# Patient Record
Sex: Male | Born: 1970 | Race: White | Hispanic: No | Marital: Married | Smoking: Current every day smoker
Health system: Southern US, Community
[De-identification: ages and names within clinical notes are randomized; demographics above are authoritative.]

## PROBLEM LIST (undated history)

## (undated) DIAGNOSIS — R3915 Urgency of urination: Secondary | ICD-10-CM

## (undated) DIAGNOSIS — R350 Frequency of micturition: Secondary | ICD-10-CM

## (undated) DIAGNOSIS — G4733 Obstructive sleep apnea (adult) (pediatric): Secondary | ICD-10-CM

## (undated) DIAGNOSIS — N201 Calculus of ureter: Secondary | ICD-10-CM

## (undated) DIAGNOSIS — Z8781 Personal history of (healed) traumatic fracture: Secondary | ICD-10-CM

## (undated) HISTORY — PX: INGUINAL HERNIA REPAIR: SUR1180

## (undated) HISTORY — PX: KNEE ARTHROSCOPY: SUR90

---

## 2001-11-10 ENCOUNTER — Emergency Department (HOSPITAL_COMMUNITY): Admission: EM | Admit: 2001-11-10 | Discharge: 2001-11-10 | Payer: Self-pay | Admitting: *Deleted

## 2002-06-26 ENCOUNTER — Emergency Department (HOSPITAL_COMMUNITY): Admission: EM | Admit: 2002-06-26 | Discharge: 2002-06-27 | Payer: Self-pay | Admitting: Emergency Medicine

## 2003-02-26 ENCOUNTER — Emergency Department (HOSPITAL_COMMUNITY): Admission: EM | Admit: 2003-02-26 | Discharge: 2003-02-26 | Payer: Self-pay | Admitting: Emergency Medicine

## 2007-07-16 ENCOUNTER — Ambulatory Visit: Payer: Self-pay | Admitting: Internal Medicine

## 2007-07-16 DIAGNOSIS — R413 Other amnesia: Secondary | ICD-10-CM | POA: Insufficient documentation

## 2007-07-16 DIAGNOSIS — H547 Unspecified visual loss: Secondary | ICD-10-CM

## 2007-07-16 LAB — CONVERTED CEMR LAB
ALT: 19 units/L (ref 0–53)
AST: 19 units/L (ref 0–37)
Albumin: 4.1 g/dL (ref 3.5–5.2)
Alkaline Phosphatase: 58 units/L (ref 39–117)
BUN: 11 mg/dL (ref 6–23)
Basophils Absolute: 0.1 10*3/uL (ref 0.0–0.1)
Basophils Relative: 1.2 % — ABNORMAL HIGH (ref 0.0–1.0)
Bilirubin, Direct: 0.1 mg/dL (ref 0.0–0.3)
CO2: 30 meq/L (ref 19–32)
Calcium: 9.4 mg/dL (ref 8.4–10.5)
Chloride: 101 meq/L (ref 96–112)
Creatinine, Ser: 0.9 mg/dL (ref 0.4–1.5)
Eosinophils Absolute: 0.1 10*3/uL (ref 0.0–0.6)
Eosinophils Relative: 0.8 % (ref 0.0–5.0)
GFR calc Af Amer: 123 mL/min
GFR calc non Af Amer: 102 mL/min
Glucose, Bld: 81 mg/dL (ref 70–99)
HCT: 44.9 % (ref 39.0–52.0)
Hemoglobin: 15.4 g/dL (ref 13.0–17.0)
Lymphocytes Relative: 27.1 % (ref 12.0–46.0)
MCHC: 34.3 g/dL (ref 30.0–36.0)
MCV: 89.7 fL (ref 78.0–100.0)
Monocytes Absolute: 0.5 10*3/uL (ref 0.2–0.7)
Monocytes Relative: 4.7 % (ref 3.0–11.0)
Neutro Abs: 6.8 10*3/uL (ref 1.4–7.7)
Neutrophils Relative %: 66.2 % (ref 43.0–77.0)
Platelets: 255 10*3/uL (ref 150–400)
Potassium: 4.4 meq/L (ref 3.5–5.1)
RBC: 5.01 M/uL (ref 4.22–5.81)
RDW: 13.1 % (ref 11.5–14.6)
Sodium: 138 meq/L (ref 135–145)
TSH: 1.54 microintl units/mL (ref 0.35–5.50)
Total Bilirubin: 0.8 mg/dL (ref 0.3–1.2)
Total Protein: 6.6 g/dL (ref 6.0–8.3)
WBC: 10.3 10*3/uL (ref 4.5–10.5)

## 2007-07-18 ENCOUNTER — Telehealth (INDEPENDENT_AMBULATORY_CARE_PROVIDER_SITE_OTHER): Payer: Self-pay | Admitting: *Deleted

## 2007-07-20 ENCOUNTER — Encounter: Admission: RE | Admit: 2007-07-20 | Discharge: 2007-07-20 | Payer: Self-pay | Admitting: Internal Medicine

## 2007-07-21 ENCOUNTER — Encounter: Admission: RE | Admit: 2007-07-21 | Discharge: 2007-07-21 | Payer: Self-pay | Admitting: Internal Medicine

## 2007-07-26 ENCOUNTER — Encounter: Payer: Self-pay | Admitting: Internal Medicine

## 2007-09-06 ENCOUNTER — Telehealth: Payer: Self-pay | Admitting: Internal Medicine

## 2008-09-22 ENCOUNTER — Ambulatory Visit: Payer: Self-pay | Admitting: Psychology

## 2009-01-08 ENCOUNTER — Encounter: Payer: Self-pay | Admitting: Internal Medicine

## 2009-07-20 ENCOUNTER — Encounter: Admission: RE | Admit: 2009-07-20 | Discharge: 2009-07-20 | Payer: Self-pay | Admitting: General Surgery

## 2009-07-22 ENCOUNTER — Ambulatory Visit (HOSPITAL_BASED_OUTPATIENT_CLINIC_OR_DEPARTMENT_OTHER): Admission: RE | Admit: 2009-07-22 | Discharge: 2009-07-22 | Payer: Self-pay | Admitting: General Surgery

## 2010-11-17 ENCOUNTER — Emergency Department (HOSPITAL_COMMUNITY)
Admission: EM | Admit: 2010-11-17 | Discharge: 2010-11-17 | Disposition: A | Discharge status: Home / Self Care | Payer: BC Managed Care – PPO | Attending: Emergency Medicine | Admitting: Emergency Medicine

## 2010-11-17 DIAGNOSIS — R109 Unspecified abdominal pain: Secondary | ICD-10-CM | POA: Insufficient documentation

## 2010-11-17 DIAGNOSIS — Z9889 Other specified postprocedural states: Secondary | ICD-10-CM | POA: Insufficient documentation

## 2010-11-17 DIAGNOSIS — L02219 Cutaneous abscess of trunk, unspecified: Secondary | ICD-10-CM | POA: Insufficient documentation

## 2010-11-17 DIAGNOSIS — L03319 Cellulitis of trunk, unspecified: Secondary | ICD-10-CM | POA: Insufficient documentation

## 2010-11-23 LAB — POCT HEMOGLOBIN-HEMACUE: Hemoglobin: 15.4 g/dL (ref 13.0–17.0)

## 2010-11-24 LAB — DIFFERENTIAL
Basophils Relative: 1 % (ref 0–1)
Lymphs Abs: 2.6 10*3/uL (ref 0.7–4.0)
Monocytes Relative: 5 % (ref 3–12)
Neutro Abs: 5.5 10*3/uL (ref 1.7–7.7)
Neutrophils Relative %: 63 % (ref 43–77)

## 2010-11-24 LAB — CBC
MCHC: 34.5 g/dL (ref 30.0–36.0)
Platelets: 229 10*3/uL (ref 150–400)
RBC: 4.75 MIL/uL (ref 4.22–5.81)
WBC: 8.8 10*3/uL (ref 4.0–10.5)

## 2010-11-24 LAB — PROTIME-INR
INR: 1.05 (ref 0.00–1.49)
Prothrombin Time: 13.6 seconds (ref 11.6–15.2)

## 2010-11-24 LAB — APTT: aPTT: 33 seconds (ref 24–37)

## 2010-11-24 LAB — BASIC METABOLIC PANEL
BUN: 14 mg/dL (ref 6–23)
Calcium: 8.9 mg/dL (ref 8.4–10.5)
Creatinine, Ser: 0.85 mg/dL (ref 0.4–1.5)
GFR calc Af Amer: >60 mL/min (ref >60)

## 2011-06-28 ENCOUNTER — Other Ambulatory Visit: Payer: Self-pay | Admitting: Family Medicine

## 2011-06-28 DIAGNOSIS — R51 Headache: Secondary | ICD-10-CM

## 2011-07-04 ENCOUNTER — Ambulatory Visit
Admission: RE | Admit: 2011-07-04 | Discharge: 2011-07-04 | Disposition: A | Discharge status: Home / Self Care | Payer: BC Managed Care – PPO | Source: Ambulatory Visit | Attending: Family Medicine | Admitting: Family Medicine

## 2011-07-04 ENCOUNTER — Other Ambulatory Visit: Payer: BC Managed Care – PPO

## 2011-07-04 DIAGNOSIS — R51 Headache: Secondary | ICD-10-CM

## 2012-06-26 ENCOUNTER — Emergency Department (HOSPITAL_COMMUNITY)
Admission: EM | Admit: 2012-06-26 | Discharge: 2012-06-27 | Disposition: A | Discharge status: Home / Self Care | Payer: BC Managed Care – PPO | Attending: Emergency Medicine | Admitting: Emergency Medicine

## 2012-06-26 ENCOUNTER — Encounter (HOSPITAL_COMMUNITY): Payer: Self-pay | Admitting: Adult Health

## 2012-06-26 DIAGNOSIS — F17200 Nicotine dependence, unspecified, uncomplicated: Secondary | ICD-10-CM | POA: Insufficient documentation

## 2012-06-26 DIAGNOSIS — Z8659 Personal history of other mental and behavioral disorders: Secondary | ICD-10-CM | POA: Insufficient documentation

## 2012-06-26 DIAGNOSIS — L03119 Cellulitis of unspecified part of limb: Secondary | ICD-10-CM | POA: Insufficient documentation

## 2012-06-26 DIAGNOSIS — L02519 Cutaneous abscess of unspecified hand: Secondary | ICD-10-CM | POA: Insufficient documentation

## 2012-06-26 DIAGNOSIS — G4730 Sleep apnea, unspecified: Secondary | ICD-10-CM | POA: Insufficient documentation

## 2012-06-26 MED ORDER — CLINDAMYCIN HCL 150 MG PO CAPS
300.0000 mg | ORAL_CAPSULE | Freq: Once | ORAL | Status: AC
  Administered 2012-06-26: 300 mg via ORAL
  Filled 2012-06-26: qty 2

## 2012-06-26 MED ORDER — OXYCODONE-ACETAMINOPHEN 5-325 MG PO TABS
1.0000 | ORAL_TABLET | Freq: Once | ORAL | Status: AC
  Administered 2012-06-26: 1 via ORAL
  Filled 2012-06-26: qty 1

## 2012-06-26 NOTE — ED Notes (Addendum)
Reports small bump to right hand that began this am and has increased in size and redness, denies drainage.  Hot pack given. Hand red with quarter sized induration. CMS intact.

## 2012-06-26 NOTE — ED Provider Notes (Addendum)
History     CSN: [id number]  Arrival date & time 06/26/12  1914   First MD Initiated Contact with Patient 06/26/12 2221      Chief Complaint  Patient presents with  . Abscess    HPI  History provided by the patient. Vision is a 41 year old male with no significant PMH who presents with complaints of redness, pain and swelling to right dorsal hand. Symptoms began early this morning and have increased throughout the day. Patient went to urgent care Center and was then seen at [facility] orthopedics. Patient was told to follow back up tomorrow at [facility] orthopedics specialists. He has been using heat over the area but reports increasing redness, pain and swelling. Patient does report having an injury at work one week ago with small laceration to the skin near that area of pain and swelling. He denies any erythematous streaks. Denies any fever, chills or sweats. Denies any numbness or weakness or decreased range of motion in hand.    History reviewed. No pertinent past medical history.  History reviewed. No pertinent past surgical history.  History reviewed. No pertinent family history.  History  Substance Use Topics  . Smoking status: Current Some Day Smoker  . Smokeless tobacco: Not on file  . Alcohol Use: No      Review of Systems  Constitutional: Negative for fever, chills and diaphoresis.  Skin:       Redness and swelling over dorsal right hand    Allergies  Review of patient's allergies indicates no known allergies.  Home Medications   Current Outpatient Rx  Name  Route  Sig  Dispense  Refill  . ESCITALOPRAM OXALATE 10 MG PO TABS   Oral   Take 10 mg by mouth at bedtime.           BP 151/89  Pulse 82  Temp 98.9 F (37.2 C) (Oral)  Resp 16  SpO2 97%  Physical Exam  Nursing note and vitals reviewed. Constitutional: He is oriented to person, place, and time. He appears well-developed and well-nourished. No distress.  HENT:  Head:  Normocephalic.  Cardiovascular: Normal rate and regular rhythm.   Pulmonary/Chest: Effort normal and breath sounds normal.  Abdominal: Soft.  Musculoskeletal: Normal range of motion. He exhibits no edema and no tenderness.       Full range of motion of right hand and fingers. Normal strength. There is redness and tenderness over dorsal hand. See skin exam for more detail.  Neurological: He is alert and oriented to person, place, and time.  Skin: Skin is warm.       3 cm area of erythema over dorsal right hand with slight central induration and nodularity. There are two small areas of pointing and pustules. Tenderness to this area.    ED Course  Procedures   INCISION AND DRAINAGE Performed by: Angus Seller Consent: Verbal consent obtained. Risks and benefits: risks, benefits and alternatives were discussed Type: abscess  Body area: dorsal right hand  Anesthesia: local infiltration  Local anesthetic: lidocaine 2% with epinephrine  Anesthetic total: 3 ml  Complexity: simple Scalpel used for incision  Drainage: purulent  Drainage amount: small  Packing material: none  Patient tolerance: Patient tolerated the procedure well with no immediate complications.      1. Abscess of hand       MDM  10:10PM patient seen and evaluated. Patient in no acute distress. Side ultrasound used showing very small pocket of abscess under the skin. Small  I&D performed using a scalpel with slight drainage. Patient reports improvement of pain symptoms. Will place on clindamycin. Patient given orthopedic hand followup.        Angus Seller, PA 06/27/12 0559  Angus Seller, PA 07/12/12 0430

## 2012-06-26 NOTE — ED Notes (Signed)
[place] machine at bedside per provider's request.

## 2012-06-26 NOTE — ED Notes (Signed)
Patient provided with a heat pack per request.

## 2012-06-26 NOTE — ED Notes (Signed)
Patient provided with a heat pack per request.  Informed of continued need to wait for provider evaluation.

## 2012-06-27 ENCOUNTER — Observation Stay (HOSPITAL_COMMUNITY)
Admission: EM | Admit: 2012-06-27 | Discharge: 2012-06-28 | Disposition: A | Discharge status: Home / Self Care | Payer: BC Managed Care – PPO | Attending: Emergency Medicine | Admitting: Emergency Medicine

## 2012-06-27 ENCOUNTER — Encounter (HOSPITAL_COMMUNITY): Payer: Self-pay | Admitting: Family Medicine

## 2012-06-27 DIAGNOSIS — F17200 Nicotine dependence, unspecified, uncomplicated: Secondary | ICD-10-CM | POA: Insufficient documentation

## 2012-06-27 DIAGNOSIS — L03119 Cellulitis of unspecified part of limb: Secondary | ICD-10-CM

## 2012-06-27 DIAGNOSIS — L02519 Cutaneous abscess of unspecified hand: Principal | ICD-10-CM | POA: Insufficient documentation

## 2012-06-27 LAB — CBC WITH DIFFERENTIAL/PLATELET
Basophils Absolute: 0 10*3/uL (ref 0.0–0.1)
Basophils Relative: 0 % (ref 0–1)
Eosinophils Absolute: 0.2 10*3/uL (ref 0.0–0.7)
Hemoglobin: 15.3 g/dL (ref 13.0–17.0)
MCH: 30 pg (ref 26.0–34.0)
MCHC: 34.2 g/dL (ref 30.0–36.0)
Monocytes Relative: 7 % (ref 3–12)
Neutro Abs: 9.1 10*3/uL — ABNORMAL HIGH (ref 1.7–7.7)
Neutrophils Relative %: 69 % (ref 43–77)
Platelets: 240 10*3/uL (ref 150–400)

## 2012-06-27 LAB — BASIC METABOLIC PANEL
BUN: 10 mg/dL (ref 6–23)
Chloride: 100 mEq/L (ref 96–112)
GFR calc Af Amer: >90 mL/min (ref >90)
GFR calc non Af Amer: >90 mL/min (ref >90)
Potassium: 3.9 mEq/L (ref 3.5–5.1)
Sodium: 137 mEq/L (ref 135–145)

## 2012-06-27 MED ORDER — SODIUM CHLORIDE 0.9 % IV SOLN
INTRAVENOUS | Status: DC
  Administered 2012-06-27 – 2012-06-28 (×3): via INTRAVENOUS

## 2012-06-27 MED ORDER — OXYCODONE-ACETAMINOPHEN 5-325 MG PO TABS
1.0000 | ORAL_TABLET | Freq: Once | ORAL | Status: AC
  Administered 2012-06-27: 1 via ORAL
  Filled 2012-06-27: qty 1

## 2012-06-27 MED ORDER — OXYCODONE-ACETAMINOPHEN 5-325 MG PO TABS: 1.0000 | ORAL_TABLET | ORAL | Status: DC | PRN

## 2012-06-27 MED ORDER — CLINDAMYCIN HCL 300 MG PO CAPS: 300.0000 mg | ORAL_CAPSULE | Freq: Four times a day (QID) | ORAL | Status: DC

## 2012-06-27 MED ORDER — CLINDAMYCIN PHOSPHATE 600 MG/50ML IV SOLN
600.0000 mg | Freq: Once | INTRAVENOUS | Status: AC
  Administered 2012-06-27: 600 mg via INTRAVENOUS
  Filled 2012-06-27: qty 50

## 2012-06-27 MED ORDER — CLINDAMYCIN PHOSPHATE 600 MG/50ML IV SOLN
600.0000 mg | Freq: Four times a day (QID) | INTRAVENOUS | Status: DC
  Administered 2012-06-27 – 2012-06-28 (×3): 600 mg via INTRAVENOUS
  Filled 2012-06-27 (×3): qty 50

## 2012-06-27 MED ORDER — CLINDAMYCIN PHOSPHATE 600 MG/50ML IV SOLN: 600.0000 mg | Freq: Once | INTRAVENOUS | Status: AC

## 2012-06-27 MED ORDER — OXYCODONE-ACETAMINOPHEN 5-325 MG PO TABS
2.0000 | ORAL_TABLET | ORAL | Status: DC | PRN
  Administered 2012-06-28: 2 via ORAL
  Filled 2012-06-27: qty 2

## 2012-06-27 NOTE — ED Provider Notes (Signed)
Patient without a medical history was placed in CDU on a cellulitis protocol by Dr. Linwood Dibbles. Patient is here for IV antibiotics and hand consult and has received 1 dose of clindamycin.   Plan per previous provider is to place  in CDU for 24 observation and administer 4 total doses of clindamycin.  After the fourth toes the patient may be discharged and he is to followup in Dr. Ronie Spies office tomorrow afternoon.  Patient re-evaluated and is complaining of pain in the right hand along with worsening reddening and swelling, VSS. On exam: hemodynamically stable, NAD, heart w/ RRR, lungs CTAB, Chest & abd non-tender, no peripheral edema or calf tenderness.  Increasing edema approximately 3 cm past the last marked border.  No time associated with the last border.  Margins of erythema remarked and time stamped. Analgesia given.  Will continue to monitor for spreading infection.  I have discussed all this with the patient including the plan for administration of antibiotics and followup with hand surgeon. He expresses understanding and is amenable to the plan.   10:13 PM Pain medication redosed, and orders placed for 4 hour when necessary Percocet.  Erythema has not extended since the 1800 marking.  It appears to have decreased slightly and the swelling in the hand is also decreased.  Patient remains afebrile, vital signs stable, without further complaint.  Patient next dose of clindamycin is 2 AM.    Dierdre Forth, PA-C 06/27/12 2329

## 2012-06-27 NOTE — ED Notes (Signed)
Abscess to dorsal aspect of rgt hand

## 2012-06-27 NOTE — ED Notes (Signed)
Per pt was here last night for hand infection. Went to see hand specialist this am and they sent him here. Infection has gotten worse. Sent here for IV abx

## 2012-06-27 NOTE — Consult Note (Signed)
Reason for Consult:right hand swelling Referring Physician: jon knapp  Jesse [name] is an 41 y.o. male.  HPI: s/p attempted I and D in ER this am with continued pain and swelling on dorsum of right hand despite PO ABX  History reviewed. No pertinent past medical history.  History reviewed. No pertinent past surgical history.  History reviewed. No pertinent family history.  Social History:  reports that he has been smoking.  He does not have any smokeless tobacco history on file. He reports that he does not drink alcohol. His drug history not on file.  Allergies: No Known Allergies  Medications: Prior to Admission:  (Not in [name] hospital admission)  Results for orders placed during the hospital encounter of 06/27/12 (from the past 48 hour(s))  CBC WITH DIFFERENTIAL     Status: Abnormal   Collection Time   06/27/12  2:02 PM      Component Value Range Comment   WBC 13.2 (*) 4.0 - 10.5 K/uL    RBC 5.10  4.22 - 5.81 MIL/uL    Hemoglobin 15.3  13.0 - 17.0 g/dL    HCT 16.1  09.6 - 04.5 %    MCV 87.6  78.0 - 100.0 fL    MCH 30.0  26.0 - 34.0 pg    MCHC 34.2  30.0 - 36.0 g/dL    RDW 40.9  81.1 - 91.4 %    Platelets 240  150 - 400 K/uL    Neutrophils Relative 69  43 - 77 %    Neutro Abs 9.1 (*) 1.7 - 7.7 K/uL    Lymphocytes Relative 23  12 - 46 %    Lymphs Abs 3.1  0.7 - 4.0 K/uL    Monocytes Relative 7  3 - 12 %    Monocytes Absolute 0.9  0.1 - 1.0 K/uL    Eosinophils Relative 1  0 - 5 %    Eosinophils Absolute 0.2  0.0 - 0.7 K/uL    Basophils Relative 0  0 - 1 %    Basophils Absolute 0.0  0.0 - 0.1 K/uL     No results found.  Review of Systems  All other systems reviewed and are negative.   Blood pressure 134/82, pulse 81, temperature 98.3 F (36.8 C), resp. rate 18, SpO2 98.00%. Physical Exam  Constitutional: He is oriented to person, place, and time. He appears well-developed and well-nourished.  HENT:  Head: Normocephalic and atraumatic.  Cardiovascular:  Normal rate.   Respiratory: Effort normal.  Musculoskeletal:       Hands: Neurological: He is alert and oriented to person, place, and time.  Skin: Skin is warm.  Psychiatric: He has [name] normal mood and affect. His behavior is normal. Judgment and thought content normal.    Assessment/Plan: As above  Agree with IV ABX x 23 hours for cellulitis with followup in my office 11/7 afternoon  Jesse [name] 06/27/2012, 2:56 PM

## 2012-06-27 NOTE — ED Provider Notes (Addendum)
History   This chart was scribed for Jesse Kras, MD by Albertha Ghee Rifaie. This patient was seen in room TR07C/TR07C and the patient's care was started at 1:30 PM.    CSN: [id number]  Arrival date & time 06/27/12  1136   First MD Initiated Contact with Patient 06/27/12 1330      Chief Complaint  Patient presents with  . Wound Infection     The history is provided by the patient. No language interpreter was used.    Jesse [name] is a 41 y.o. male who presents to the Emergency Department complaining of gradually worsening, non-radiating, severe right arm pain with wound infection. There is associated swelling with some decreased ROM to the right arm. Patient was seen at urgent care yesterday when he was diagnosed with an abscess and had an I&D procedure performed to his hand. Patient was discharged in stable condition and was referred to [facility] orthopedics. He states following up with him this morning and was sent here for further evaluation. He has been using heat over the area but reports there has been increasing redness, pain and swelling. Patient reports having injured his hand one week ago with small laceration to the skin to the same area. He denies any erythematous streaks. He denies any numbness or weakness, fever, chills, nausea, and vomiting. The patient is a current smoker.    History reviewed. No pertinent past medical history.  History reviewed. No pertinent past surgical history.  History reviewed. No pertinent family history.  History  Substance Use Topics  . Smoking status: Current Some Day Smoker  . Smokeless tobacco: Not on file  . Alcohol Use: No      Review of Systems A complete 10 system review of systems was obtained and all systems are negative except as noted in the HPI and PMH.   Allergies  Review of patient's allergies indicates no known allergies.  Home Medications   Current Outpatient Rx  Name  Route  Sig  Dispense  Refill  .  CLINDAMYCIN HCL 300 MG PO CAPS   Oral   Take 1 capsule (300 mg total) by mouth 4 (four) times daily. X 7 days   28 capsule   0   . ESCITALOPRAM OXALATE 10 MG PO TABS   Oral   Take 10 mg by mouth at bedtime.         . OXYCODONE-ACETAMINOPHEN 5-325 MG PO TABS   Oral   Take 1 tablet by mouth every 4 (four) hours as needed for pain.   10 tablet   0     BP 134/82  Pulse 81  Temp 98.3 F (36.8 C)  Resp 18  SpO2 98%  Physical Exam  Nursing note and vitals reviewed. Constitutional: He appears well-developed and well-nourished. No distress.  HENT:  Head: Normocephalic and atraumatic.  Right Ear: External ear normal.  Left Ear: External ear normal.  Eyes: Conjunctivae normal are normal. Right eye exhibits no discharge. Left eye exhibits no discharge. No scleral icterus.  Neck: Neck supple. No tracheal deviation present.  Cardiovascular: Normal rate.   Pulmonary/Chest: Effort normal. No stridor. No respiratory distress.  Musculoskeletal: He exhibits no edema.       Swelling and erythema to dorsum of right hand. Mild induration present. Able to passively flex and extend hand. Mild discomfort with full flexion of the hand.  Neurological: He is alert. Cranial nerve deficit: no gross deficits.  Skin: Skin is warm and dry. No rash  noted.  Psychiatric: He has a normal mood and affect.    ED Course  Procedures (including critical care time)  DIAGNOSTIC STUDIES: Oxygen Saturation is 98% on room air, normal by my interpretation.    COORDINATION OF CARE: 1:45 PM Discussed treatment plan  with pt at bedside and pt agreed to plan.  Labs Reviewed  CBC WITH DIFFERENTIAL - Abnormal; Notable for the following:    WBC 13.2 (*)     Neutro Abs 9.1 (*)     All other components within normal limits  BASIC METABOLIC PANEL   No results found.   MDM  Patient appears to have a hand cellulitis. The symptoms have been progressing. At this time is not clear that he has a tenosynovitis. It  is certainly possible that he could be on the early part of that spectrum.   He has been seen in the emergency department as well as by orthopedics previously. He started taking oral clindamycin however the symptoms have progressed. Patient was referred to Dr. Mina Marble. He suggests the patient in the emergency room IV antibiotics and he plans on seeing the patient here in the emergency department. I have spoken with Dr. Ronie Spies office. They will let Dr. Mina Marble know that he is here. I have ordered IV antibiotics, and we will continue treatment in the clinical decision unit area  I personally performed the services described in this documentation, which was scribed in my presence.  The recorded information has been reviewed and considered.    Jesse Kras, MD 06/27/12 1354  Pt was seen by Dr Mina Marble.  No need for or drainage at this time.  Recc 24 hour abx.  DC home and follow up in his office tomorrow afternoon for recheck.  Jesse Kras, MD 06/27/12 [phone]

## 2012-06-27 NOTE — ED Notes (Signed)
Dinner tray ordered.

## 2012-06-28 ENCOUNTER — Other Ambulatory Visit: Payer: Self-pay | Admitting: Orthopedic Surgery

## 2012-06-28 ENCOUNTER — Encounter (HOSPITAL_COMMUNITY): Payer: Self-pay | Admitting: *Deleted

## 2012-06-28 MED ORDER — CLINDAMYCIN HCL 150 MG PO CAPS: 300.0000 mg | ORAL_CAPSULE | Freq: Three times a day (TID) | ORAL | Status: DC

## 2012-06-28 NOTE — ED Notes (Signed)
Report to greg rn

## 2012-06-28 NOTE — ED Provider Notes (Signed)
Medical screening examination/treatment/procedure(s) were performed by non-physician practitioner and as supervising physician I was immediately available for consultation/collaboration.  John-Adam Tzipporah Nagorski, M.D.     John-Adam Elaura Calix, MD 06/28/12 1727 

## 2012-06-28 NOTE — ED Provider Notes (Signed)
Medical screening examination/treatment/procedure(s) were conducted as a shared visit with non-physician practitioner(s) and myself.  I personally evaluated the patient during the encounter   Jesse Kras, MD 06/28/12 1504

## 2012-06-28 NOTE — ED Provider Notes (Signed)
9:14 AM Patient with a hx sig for wound infection was placed in CDU on cellulitis protocol. Patient care resumed from Dr. Effie Shy.  Patient is here for IV antibiotics and has received IV antibiotics. While in obeservation over night the pt slept well and had no complaints, per nursing staff. Patient re-evaluated and is resting comfortable, VSS, with no new complaints or concerns at this time. Plan per previous provider is to receive 4 doses of IV antibiotics and be discharged. If no improvement, Dr. Mina Marble will see the patient. On exam: hemodynamically stable, NAD, heart w/ RRR, lungs CTAB, Chest & abd non-tender, no peripheral edema or calf tenderness.  9:31 AM Patient has received fourth dose of Clindamycin and is ready for discharge. Erythema has no extended past purple markings. Patient will follow up with Dr. Mina Marble today. Vitals stable.     Emilia Beck, PA-C 06/28/12 1123

## 2012-06-29 ENCOUNTER — Ambulatory Visit (HOSPITAL_COMMUNITY)
Admission: RE | Admit: 2012-06-29 | Discharge: 2012-06-29 | Disposition: A | Discharge status: Home / Self Care | Payer: BC Managed Care – PPO | Source: Ambulatory Visit | Attending: Orthopedic Surgery | Admitting: Orthopedic Surgery

## 2012-06-29 ENCOUNTER — Encounter (HOSPITAL_COMMUNITY): Payer: Self-pay | Admitting: Anesthesiology

## 2012-06-29 ENCOUNTER — Encounter (HOSPITAL_COMMUNITY): Payer: Self-pay

## 2012-06-29 ENCOUNTER — Encounter (HOSPITAL_COMMUNITY)
Admission: RE | Disposition: A | Discharge status: Home / Self Care | Payer: Self-pay | Source: Ambulatory Visit | Attending: Orthopedic Surgery

## 2012-06-29 DIAGNOSIS — Z538 Procedure and treatment not carried out for other reasons: Secondary | ICD-10-CM | POA: Insufficient documentation

## 2012-06-29 DIAGNOSIS — L089 Local infection of the skin and subcutaneous tissue, unspecified: Secondary | ICD-10-CM | POA: Insufficient documentation

## 2012-06-29 LAB — SURGICAL PCR SCREEN
MRSA, PCR: NEGATIVE
Staphylococcus aureus: NEGATIVE

## 2012-06-29 SURGERY — CANCELLED PROCEDURE

## 2012-06-29 MED ORDER — CHLORHEXIDINE GLUCONATE 4 % EX LIQD: 60.0000 mL | Freq: Once | CUTANEOUS | Status: DC

## 2012-06-29 MED ORDER — LACTATED RINGERS IV SOLN
INTRAVENOUS | Status: DC
  Administered 2012-06-29: 15:00:00 via INTRAVENOUS

## 2012-06-29 MED ORDER — MUPIROCIN 2 % EX OINT
TOPICAL_OINTMENT | Freq: Once | CUTANEOUS | Status: AC
  Administered 2012-06-29: 1 via NASAL

## 2012-06-29 MED ORDER — MUPIROCIN 2 % EX OINT
TOPICAL_OINTMENT | CUTANEOUS | Status: AC
  Administered 2012-06-29: 1 via NASAL
  Filled 2012-06-29: qty 22

## 2012-06-29 SURGICAL SUPPLY — 50 items
BANDAGE CONFORM 2  STR LF (GAUZE/BANDAGES/DRESSINGS) IMPLANT
BANDAGE ELASTIC 3 VELCRO ST LF (GAUZE/BANDAGES/DRESSINGS) ×3 IMPLANT
BANDAGE ELASTIC 4 VELCRO ST LF (GAUZE/BANDAGES/DRESSINGS) ×3 IMPLANT
BANDAGE GAUZE ELAST BULKY 4 IN (GAUZE/BANDAGES/DRESSINGS) ×6 IMPLANT
BNDG CMPR 9X4 STRL LF SNTH (GAUZE/BANDAGES/DRESSINGS)
BNDG ESMARK 4X9 LF (GAUZE/BANDAGES/DRESSINGS) IMPLANT
CLOTH BEACON ORANGE TIMEOUT ST (SAFETY) ×3 IMPLANT
CORDS BIPOLAR (ELECTRODE) IMPLANT
COVER SURGICAL LIGHT HANDLE (MISCELLANEOUS) ×6 IMPLANT
CUFF TOURNIQUET SINGLE 18IN (TOURNIQUET CUFF) ×3 IMPLANT
DECANTER SPIKE VIAL GLASS SM (MISCELLANEOUS) ×3 IMPLANT
DRAIN PENROSE 1/4X12 LTX STRL (WOUND CARE) IMPLANT
DRAPE OEC MINIVIEW 54X84 (DRAPES) IMPLANT
DRAPE SURG 17X23 STRL (DRAPES) ×3 IMPLANT
DRSG PAD ABDOMINAL 8X10 ST (GAUZE/BANDAGES/DRESSINGS) ×6 IMPLANT
DURAPREP 26ML APPLICATOR (WOUND CARE) IMPLANT
ELECT REM PT RETURN 9FT ADLT (ELECTROSURGICAL)
ELECTRODE REM PT RTRN 9FT ADLT (ELECTROSURGICAL) IMPLANT
GAUZE PACKING IODOFORM 1/4X5 (PACKING) IMPLANT
GAUZE XEROFORM 1X8 LF (GAUZE/BANDAGES/DRESSINGS) ×3 IMPLANT
GLOVE BIO SURGEON STRL SZ8.5 (GLOVE) ×3 IMPLANT
GOWN PREVENTION PLUS XLARGE (GOWN DISPOSABLE) ×3 IMPLANT
GOWN STRL NON-REIN LRG LVL3 (GOWN DISPOSABLE) ×3 IMPLANT
HANDPIECE INTERPULSE COAX TIP (DISPOSABLE)
KIT BASIN OR (CUSTOM PROCEDURE TRAY) ×3 IMPLANT
KIT ROOM TURNOVER OR (KITS) ×3 IMPLANT
MANIFOLD NEPTUNE II (INSTRUMENTS) ×3 IMPLANT
NDL HYPO 25GX1X1/2 BEV (NEEDLE) IMPLANT
NDL HYPO 25X1 1.5 SAFETY (NEEDLE) ×1 IMPLANT
NEEDLE HYPO 25GX1X1/2 BEV (NEEDLE) IMPLANT
NEEDLE HYPO 25X1 1.5 SAFETY (NEEDLE) ×2 IMPLANT
NS IRRIG 1000ML POUR BTL (IV SOLUTION) ×3 IMPLANT
PACK ORTHO EXTREMITY (CUSTOM PROCEDURE TRAY) ×3 IMPLANT
PAD ARMBOARD 7.5X6 YLW CONV (MISCELLANEOUS) ×6 IMPLANT
PAD CAST 4YDX4 CTTN HI CHSV (CAST SUPPLIES) ×4 IMPLANT
PADDING CAST COTTON 4X4 STRL (CAST SUPPLIES) ×4
SET HNDPC FAN SPRY TIP SCT (DISPOSABLE) IMPLANT
SPONGE GAUZE 4X4 12PLY (GAUZE/BANDAGES/DRESSINGS) ×6 IMPLANT
SPONGE LAP 18X18 X RAY DECT (DISPOSABLE) ×3 IMPLANT
SUCTION FRAZIER TIP 10 FR DISP (SUCTIONS) ×3 IMPLANT
SUT VICRYL RAPIDE 4/0 PS 2 (SUTURE) IMPLANT
SYR 20CC LL (SYRINGE) IMPLANT
SYR CONTROL 10ML LL (SYRINGE) ×3 IMPLANT
TOWEL OR 17X24 6PK STRL BLUE (TOWEL DISPOSABLE) ×3 IMPLANT
TOWEL OR 17X26 10 PK STRL BLUE (TOWEL DISPOSABLE) ×3 IMPLANT
TUBE ANAEROBIC SPECIMEN COL (MISCELLANEOUS) IMPLANT
TUBE CONNECTING 12X1/4 (SUCTIONS) ×3 IMPLANT
UNDERPAD 30X30 INCONTINENT (UNDERPADS AND DIAPERS) ×3 IMPLANT
WATER STERILE IRR 1000ML POUR (IV SOLUTION) ×3 IMPLANT
YANKAUER SUCT BULB TIP NO VENT (SUCTIONS) ×3 IMPLANT

## 2012-06-29 NOTE — Anesthesia Preprocedure Evaluation (Deleted)
Anesthesia Evaluation  Patient identified by MRN, date of birth, ID band Patient awake    Reviewed: Allergy & Precautions, H&P , NPO status , Patient's Chart, lab work & pertinent test results  Airway Mallampati: II      Dental  (+) Teeth Intact and Dental Advisory Given   Pulmonary  breath sounds clear to auscultation        Cardiovascular Rhythm:Regular Rate:Normal     Neuro/Psych    GI/Hepatic   Endo/Other    Renal/GU      Musculoskeletal   Abdominal   Peds  Hematology   Anesthesia Other Findings   Reproductive/Obstetrics                           Anesthesia Physical Anesthesia Plan  ASA: II  Anesthesia Plan: General   Post-op Pain Management:    Induction: Intravenous  Airway Management Planned: LMA  Additional Equipment:   Intra-op Plan:   Post-operative Plan:   Informed Consent: I have reviewed the patients History and Physical, chart, labs and discussed the procedure including the risks, benefits and alternatives for the proposed anesthesia with the patient or authorized representative who has indicated his/her understanding and acceptance.   Dental advisory given  Plan Discussed with: CRNA and Surgeon  Anesthesia Plan Comments:         Anesthesia Quick Evaluation  

## 2012-06-29 NOTE — Progress Notes (Signed)
Per Dr. Mina Marble case cancelled patient given instructions for further care by Dr. Mina Marble.

## 2012-07-03 NOTE — ED Provider Notes (Signed)
Medical screening examination/treatment/procedure(s) were performed by non-physician practitioner and as supervising physician I was immediately available for consultation/collaboration.  Flint Melter, MD 07/03/12 [phone]

## 2012-07-12 NOTE — ED Provider Notes (Signed)
Medical screening examination/treatment/procedure(s) were performed by non-physician practitioner and as supervising physician I was immediately available for consultation/collaboration.  Jones Skene, M.D.      Jones Skene, MD 07/12/12 [phone]

## 2014-09-04 ENCOUNTER — Encounter (HOSPITAL_COMMUNITY): Payer: Self-pay | Admitting: Orthopedic Surgery

## 2015-10-12 ENCOUNTER — Other Ambulatory Visit: Payer: Self-pay | Admitting: Physician Assistant

## 2015-10-12 DIAGNOSIS — M7989 Other specified soft tissue disorders: Secondary | ICD-10-CM

## 2015-10-15 ENCOUNTER — Ambulatory Visit
Admission: RE | Admit: 2015-10-15 | Discharge: 2015-10-15 | Disposition: A | Discharge status: Home / Self Care | Payer: Managed Care, Other (non HMO) | Source: Ambulatory Visit | Attending: Physician Assistant | Admitting: Physician Assistant

## 2015-10-15 DIAGNOSIS — M7989 Other specified soft tissue disorders: Secondary | ICD-10-CM

## 2015-12-12 ENCOUNTER — Ambulatory Visit (HOSPITAL_COMMUNITY)
Admission: EM | Admit: 2015-12-12 | Discharge: 2015-12-12 | Disposition: A | Discharge status: Home / Self Care | Payer: Managed Care, Other (non HMO) | Attending: Emergency Medicine | Admitting: Emergency Medicine

## 2015-12-12 ENCOUNTER — Encounter (HOSPITAL_COMMUNITY): Payer: Self-pay

## 2015-12-12 DIAGNOSIS — T2010XA Burn of first degree of head, face, and neck, unspecified site, initial encounter: Secondary | ICD-10-CM

## 2015-12-12 DIAGNOSIS — T23101A Burn of first degree of right hand, unspecified site, initial encounter: Secondary | ICD-10-CM
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
## 2016-06-04 ENCOUNTER — Inpatient Hospital Stay (HOSPITAL_COMMUNITY)
Admission: EM | Admit: 2016-06-04 | Discharge: 2016-06-09 | DRG: 519 | Disposition: A | Discharge status: Home Health | Payer: Managed Care, Other (non HMO) | Attending: Orthopedic Surgery | Admitting: Orthopedic Surgery

## 2016-06-04 ENCOUNTER — Emergency Department (HOSPITAL_COMMUNITY): Payer: Managed Care, Other (non HMO)

## 2016-06-04 ENCOUNTER — Encounter (HOSPITAL_COMMUNITY): Payer: Self-pay | Admitting: Emergency Medicine

## 2016-06-04 DIAGNOSIS — S32810A Multiple fractures of pelvis with stable disruption of pelvic ring, initial encounter for closed fracture: Secondary | ICD-10-CM

## 2016-06-04 DIAGNOSIS — F17210 Nicotine dependence, cigarettes, uncomplicated: Secondary | ICD-10-CM | POA: Diagnosis present

## 2016-06-04 DIAGNOSIS — S50312A Abrasion of left elbow, initial encounter: Secondary | ICD-10-CM | POA: Diagnosis present

## 2016-06-04 DIAGNOSIS — F17200 Nicotine dependence, unspecified, uncomplicated: Secondary | ICD-10-CM | POA: Diagnosis present

## 2016-06-04 DIAGNOSIS — R262 Difficulty in walking, not elsewhere classified: Secondary | ICD-10-CM

## 2016-06-04 DIAGNOSIS — T1490XA Injury, unspecified, initial encounter: Secondary | ICD-10-CM

## 2016-06-04 DIAGNOSIS — S32512A Fracture of superior rim of left pubis, initial encounter for closed fracture: Secondary | ICD-10-CM | POA: Diagnosis present

## 2016-06-04 DIAGNOSIS — S3210XA Unspecified fracture of sacrum, initial encounter for closed fracture: Secondary | ICD-10-CM | POA: Diagnosis not present

## 2016-06-04 DIAGNOSIS — S32402A Unspecified fracture of left acetabulum, initial encounter for closed fracture: Secondary | ICD-10-CM

## 2016-06-04 DIAGNOSIS — T148XXA Other injury of unspecified body region, initial encounter: Secondary | ICD-10-CM

## 2016-06-04 DIAGNOSIS — S329XXA Fracture of unspecified parts of lumbosacral spine and pelvis, initial encounter for closed fracture: Secondary | ICD-10-CM

## 2016-06-04 DIAGNOSIS — Y92410 Unspecified street and highway as the place of occurrence of the external cause: Secondary | ICD-10-CM

## 2016-06-04 DIAGNOSIS — R402362 Coma scale, best motor response, obeys commands, at arrival to emergency department: Secondary | ICD-10-CM | POA: Diagnosis present

## 2016-06-04 DIAGNOSIS — R402252 Coma scale, best verbal response, oriented, at arrival to emergency department: Secondary | ICD-10-CM | POA: Diagnosis present

## 2016-06-04 DIAGNOSIS — R402142 Coma scale, eyes open, spontaneous, at arrival to emergency department: Secondary | ICD-10-CM | POA: Diagnosis present

## 2016-06-04 DIAGNOSIS — D62 Acute posthemorrhagic anemia: Secondary | ICD-10-CM | POA: Diagnosis not present

## 2016-06-04 LAB — I-STAT CHEM 8, ED
BUN: 15 mg/dL (ref 6–20)
BUN: 12 mg/dL (ref 6–20)
CHLORIDE: 104 mmol/L (ref 101–111)
Calcium, Ion: 1.08 mmol/L — ABNORMAL LOW (ref 1.15–1.40)
Calcium, Ion: 1.09 mmol/L — ABNORMAL LOW (ref 1.15–1.40)
Chloride: 104 mmol/L (ref 101–111)
Creatinine, Ser: 1 mg/dL (ref 0.61–1.24)
Creatinine, Ser: 0.9 mg/dL (ref 0.61–1.24)
Glucose, Bld: 94 mg/dL (ref 65–99)
Glucose, Bld: 92 mg/dL (ref 65–99)
HEMATOCRIT: 44 % (ref 39.0–52.0)
HEMATOCRIT: 41 % (ref 39.0–52.0)
HEMOGLOBIN: 15 g/dL (ref 13.0–17.0)
Hemoglobin: 13.9 g/dL (ref 13.0–17.0)
POTASSIUM: 4.4 mmol/L (ref 3.5–5.1)
Potassium: 6.6 mmol/L (ref 3.5–5.1)
SODIUM: 139 mmol/L (ref 135–145)
SODIUM: 142 mmol/L (ref 135–145)
TCO2: 28 mmol/L (ref 0–100)
TCO2: 25 mmol/L (ref 0–100)

## 2016-06-04 LAB — URINALYSIS, ROUTINE W REFLEX MICROSCOPIC
Bilirubin Urine: NEGATIVE
GLUCOSE, UA: NEGATIVE mg/dL
KETONES UR: NEGATIVE mg/dL
LEUKOCYTES UA: NEGATIVE
NITRITE: NEGATIVE
PH: 5 (ref 5.0–8.0)
PROTEIN: NEGATIVE mg/dL
Specific Gravity, Urine: 1.029 (ref 1.005–1.030)

## 2016-06-04 LAB — COMPREHENSIVE METABOLIC PANEL
ALT: 17 U/L (ref 17–63)
ANION GAP: 6 (ref 5–15)
AST: 29 U/L (ref 15–41)
Albumin: 3.5 g/dL (ref 3.5–5.0)
Alkaline Phosphatase: 47 U/L (ref 38–126)
BILIRUBIN TOTAL: 0.4 mg/dL (ref 0.3–1.2)
BUN: 10 mg/dL (ref 6–20)
CO2: 24 mmol/L (ref 22–32)
Calcium: 8.1 mg/dL — ABNORMAL LOW (ref 8.9–10.3)
Chloride: 106 mmol/L (ref 101–111)
Creatinine, Ser: 0.91 mg/dL (ref 0.61–1.24)
GFR calc Af Amer: >60 mL/min (ref >60)
Glucose, Bld: 96 mg/dL (ref 65–99)
POTASSIUM: 4.4 mmol/L (ref 3.5–5.1)
Sodium: 136 mmol/L (ref 135–145)
TOTAL PROTEIN: 5.7 g/dL — AB (ref 6.5–8.1)

## 2016-06-04 LAB — PROTIME-INR
INR: 1.07
Prothrombin Time: 13.9 seconds (ref 11.4–15.2)

## 2016-06-04 LAB — CBC WITH DIFFERENTIAL/PLATELET
Basophils Absolute: 0 10*3/uL (ref 0.0–0.1)
Basophils Relative: 0 %
EOS ABS: 0.2 10*3/uL (ref 0.0–0.7)
Eosinophils Relative: 1 %
HCT: 40.4 % (ref 39.0–52.0)
HEMOGLOBIN: 13.7 g/dL (ref 13.0–17.0)
LYMPHS ABS: 2.5 10*3/uL (ref 0.7–4.0)
LYMPHS PCT: 19 %
MCH: 30 pg (ref 26.0–34.0)
MCHC: 33.9 g/dL (ref 30.0–36.0)
MCV: 88.4 fL (ref 78.0–100.0)
Monocytes Absolute: 0.8 10*3/uL (ref 0.1–1.0)
Monocytes Relative: 6 %
NEUTROS ABS: 9.7 10*3/uL — AB (ref 1.7–7.7)
NEUTROS PCT: 74 %
Platelets: 226 10*3/uL (ref 150–400)
RBC: 4.57 MIL/uL (ref 4.22–5.81)
RDW: 13.2 % (ref 11.5–15.5)
WBC: 13.2 10*3/uL — AB (ref 4.0–10.5)

## 2016-06-04 LAB — SAMPLE TO BLOOD BANK

## 2016-06-04 LAB — URINE MICROSCOPIC-ADD ON
RBC / HPF: NONE SEEN RBC/hpf (ref 0–5)
WBC, UA: NONE SEEN WBC/hpf (ref 0–5)

## 2016-06-04 LAB — CBC
HCT: 44.3 % (ref 39.0–52.0)
HEMOGLOBIN: 14.7 g/dL (ref 13.0–17.0)
MCH: 29.5 pg (ref 26.0–34.0)
MCHC: 33.2 g/dL (ref 30.0–36.0)
MCV: 88.8 fL (ref 78.0–100.0)
Platelets: 240 10*3/uL (ref 150–400)
RBC: 4.99 MIL/uL (ref 4.22–5.81)
RDW: 13.2 % (ref 11.5–15.5)
WBC: 13.1 10*3/uL — AB (ref 4.0–10.5)

## 2016-06-04 LAB — I-STAT CG4 LACTIC ACID, ED: Lactic Acid, Venous: 0.75 mmol/L (ref 0.5–1.9)

## 2016-06-04 LAB — CDS SEROLOGY

## 2016-06-04 LAB — ETHANOL

## 2016-06-04 MED ORDER — ONDANSETRON HCL 4 MG/2ML IJ SOLN
4.0000 mg | Freq: Once | INTRAMUSCULAR | Status: AC
  Administered 2016-06-04: 4 mg via INTRAVENOUS

## 2016-06-04 MED ORDER — ONDANSETRON HCL 4 MG/2ML IJ SOLN
INTRAMUSCULAR | Status: AC
  Filled 2016-06-04: qty 2

## 2016-06-04 MED ORDER — HYDROMORPHONE HCL 1 MG/ML IJ SOLN
INTRAMUSCULAR | Status: AC
  Filled 2016-06-04: qty 1

## 2016-06-04 MED ORDER — SODIUM CHLORIDE 0.9 % IV BOLUS (SEPSIS): 1000.0000 mL | Freq: Once | INTRAVENOUS | Status: DC

## 2016-06-04 MED ORDER — IOPAMIDOL (ISOVUE-300) INJECTION 61%
INTRAVENOUS | Status: AC
  Administered 2016-06-04: 100 mL
  Filled 2016-06-04: qty 100

## 2016-06-04 MED ORDER — HYDROMORPHONE HCL 1 MG/ML IJ SOLN
1.0000 mg | Freq: Once | INTRAMUSCULAR | Status: AC
  Administered 2016-06-04: 1 mg via INTRAVENOUS
  Filled 2016-06-04: qty 1

## 2016-06-04 MED ORDER — HYDROMORPHONE HCL 1 MG/ML IJ SOLN
INTRAMUSCULAR | Status: DC | PRN
  Administered 2016-06-04: 1 mg via INTRAVENOUS

## 2016-06-04 MED ORDER — HYDROMORPHONE HCL 1 MG/ML IJ SOLN: 1.0000 mg | Freq: Once | INTRAMUSCULAR | Status: DC

## 2016-06-04 MED ORDER — HYDROMORPHONE HCL 1 MG/ML IJ SOLN
1.0000 mg | Freq: Once | INTRAMUSCULAR | Status: AC
  Administered 2016-06-04: 1 mg via INTRAVENOUS

## 2016-06-04 MED ORDER — SODIUM CHLORIDE 0.9 % IV SOLN
INTRAVENOUS | Status: AC | PRN
  Administered 2016-06-04: 1000 mL via INTRAVENOUS
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
## 2016-06-04 NOTE — ED Notes (Signed)
Family updated as to patient's status, wife at bedside at this time.
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
## 2016-06-04 NOTE — ED Notes (Signed)
Family requesting update, MD notified.

## 2016-06-04 NOTE — ED Notes (Signed)
Family in waiting room 
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
## 2016-06-05 ENCOUNTER — Inpatient Hospital Stay (HOSPITAL_COMMUNITY): Payer: Managed Care, Other (non HMO)

## 2016-06-05 ENCOUNTER — Encounter (HOSPITAL_COMMUNITY)
Admission: EM | Disposition: A | Discharge status: Home Health | Payer: Self-pay | Source: Home / Self Care | Attending: Orthopedic Surgery

## 2016-06-05 ENCOUNTER — Inpatient Hospital Stay (HOSPITAL_COMMUNITY): Payer: Managed Care, Other (non HMO) | Admitting: Anesthesiology

## 2016-06-05 ENCOUNTER — Encounter (HOSPITAL_COMMUNITY): Payer: Self-pay | Admitting: Orthopedic Surgery

## 2016-06-05 DIAGNOSIS — R402362 Coma scale, best motor response, obeys commands, at arrival to emergency department: Secondary | ICD-10-CM | POA: Diagnosis present

## 2016-06-05 DIAGNOSIS — Z8781 Personal history of (healed) traumatic fracture: Secondary | ICD-10-CM

## 2016-06-05 DIAGNOSIS — R402252 Coma scale, best verbal response, oriented, at arrival to emergency department: Secondary | ICD-10-CM | POA: Diagnosis present

## 2016-06-05 DIAGNOSIS — R402142 Coma scale, eyes open, spontaneous, at arrival to emergency department: Secondary | ICD-10-CM | POA: Diagnosis present

## 2016-06-05 DIAGNOSIS — Y92410 Unspecified street and highway as the place of occurrence of the external cause: Secondary | ICD-10-CM | POA: Diagnosis not present

## 2016-06-05 DIAGNOSIS — S50312A Abrasion of left elbow, initial encounter: Secondary | ICD-10-CM | POA: Diagnosis present

## 2016-06-05 DIAGNOSIS — S32512A Fracture of superior rim of left pubis, initial encounter for closed fracture: Secondary | ICD-10-CM | POA: Diagnosis present

## 2016-06-05 DIAGNOSIS — F17200 Nicotine dependence, unspecified, uncomplicated: Secondary | ICD-10-CM | POA: Diagnosis present

## 2016-06-05 DIAGNOSIS — D62 Acute posthemorrhagic anemia: Secondary | ICD-10-CM | POA: Diagnosis not present

## 2016-06-05 DIAGNOSIS — F17210 Nicotine dependence, cigarettes, uncomplicated: Secondary | ICD-10-CM | POA: Diagnosis present

## 2016-06-05 DIAGNOSIS — T1490XA Injury, unspecified, initial encounter: Secondary | ICD-10-CM | POA: Diagnosis present

## 2016-06-05 DIAGNOSIS — S3210XA Unspecified fracture of sacrum, initial encounter for closed fracture: Secondary | ICD-10-CM | POA: Diagnosis present

## 2016-06-05 HISTORY — DX: Personal history of (healed) traumatic fracture: Z87.81

## 2016-06-05 HISTORY — PX: ORIF PELVIC FRACTURE: SHX2128

## 2016-06-05 LAB — BASIC METABOLIC PANEL
Anion gap: 6 (ref 5–15)
BUN: 9 mg/dL (ref 6–20)
CALCIUM: 8.8 mg/dL — AB (ref 8.9–10.3)
CHLORIDE: 106 mmol/L (ref 101–111)
CO2: 28 mmol/L (ref 22–32)
CREATININE: 0.9 mg/dL (ref 0.61–1.24)
GFR calc non Af Amer: >60 mL/min (ref >60)
Glucose, Bld: 124 mg/dL — ABNORMAL HIGH (ref 65–99)
Potassium: 4.1 mmol/L (ref 3.5–5.1)
SODIUM: 140 mmol/L (ref 135–145)

## 2016-06-05 LAB — CBC
HCT: 42.2 % (ref 39.0–52.0)
HEMOGLOBIN: 14 g/dL (ref 13.0–17.0)
MCH: 29.5 pg (ref 26.0–34.0)
MCHC: 33.2 g/dL (ref 30.0–36.0)
MCV: 89 fL (ref 78.0–100.0)
Platelets: 220 10*3/uL (ref 150–400)
RBC: 4.74 MIL/uL (ref 4.22–5.81)
RDW: 13.3 % (ref 11.5–15.5)
WBC: 14.8 10*3/uL — ABNORMAL HIGH (ref 4.0–10.5)

## 2016-06-05 LAB — SURGICAL PCR SCREEN
MRSA, PCR: NEGATIVE
Staphylococcus aureus: POSITIVE — AB

## 2016-06-05 SURGERY — OPEN REDUCTION INTERNAL FIXATION (ORIF) PELVIC FRACTURE
Anesthesia: General | Site: Pelvis

## 2016-06-05 MED ORDER — ONDANSETRON HCL 4 MG/2ML IJ SOLN: 4.0000 mg | Freq: Four times a day (QID) | INTRAMUSCULAR | Status: DC | PRN

## 2016-06-05 MED ORDER — PANTOPRAZOLE SODIUM 40 MG PO TBEC
40.0000 mg | DELAYED_RELEASE_TABLET | Freq: Every day | ORAL | Status: DC
Start: 2016-06-05 — End: 2016-06-09
  Administered 2016-06-05 – 2016-06-09 (×5): 40 mg via ORAL
  Filled 2016-06-05 (×5): qty 1

## 2016-06-05 MED ORDER — HYDROMORPHONE HCL 1 MG/ML IJ SOLN
1.0000 mg | INTRAMUSCULAR | Status: DC | PRN
  Administered 2016-06-05 – 2016-06-07 (×7): 1 mg via INTRAVENOUS
  Filled 2016-06-05: qty 2
  Filled 2016-06-05 (×6): qty 1

## 2016-06-05 MED ORDER — SUGAMMADEX SODIUM 200 MG/2ML IV SOLN
INTRAVENOUS | Status: DC | PRN
  Administered 2016-06-05: 154.2 mg via INTRAVENOUS

## 2016-06-05 MED ORDER — PHENYLEPHRINE HCL 10 MG/ML IJ SOLN
INTRAMUSCULAR | Status: DC | PRN
  Administered 2016-06-05: 80 ug via INTRAVENOUS

## 2016-06-05 MED ORDER — OXYCODONE HCL 5 MG/5ML PO SOLN: 5.0000 mg | Freq: Once | ORAL | Status: DC | PRN

## 2016-06-05 MED ORDER — ACETAMINOPHEN 325 MG PO TABS
650.0000 mg | ORAL_TABLET | Freq: Four times a day (QID) | ORAL | Status: DC | PRN
  Administered 2016-06-06: 650 mg via ORAL

## 2016-06-05 MED ORDER — ONDANSETRON HCL 4 MG PO TABS: 4.0000 mg | ORAL_TABLET | Freq: Four times a day (QID) | ORAL | Status: DC | PRN

## 2016-06-05 MED ORDER — METOCLOPRAMIDE HCL 5 MG/ML IJ SOLN
5.0000 mg | Freq: Three times a day (TID) | INTRAMUSCULAR | Status: DC | PRN
  Administered 2016-06-06: 10 mg via INTRAVENOUS
  Filled 2016-06-05: qty 2

## 2016-06-05 MED ORDER — CEFAZOLIN SODIUM-DEXTROSE 2-3 GM-% IV SOLR
INTRAVENOUS | Status: DC | PRN
  Administered 2016-06-05: 2 g via INTRAVENOUS

## 2016-06-05 MED ORDER — PROPOFOL 10 MG/ML IV BOLUS
INTRAVENOUS | Status: AC
  Filled 2016-06-05: qty 20

## 2016-06-05 MED ORDER — HYDROMORPHONE HCL 1 MG/ML IJ SOLN
INTRAMUSCULAR | Status: AC
  Administered 2016-06-05: 0.5 mg via INTRAVENOUS
  Filled 2016-06-05: qty 1

## 2016-06-05 MED ORDER — OXYCODONE HCL 5 MG PO TABS: 5.0000 mg | ORAL_TABLET | ORAL | Status: DC | PRN

## 2016-06-05 MED ORDER — PHENYLEPHRINE HCL 10 MG/ML IJ SOLN: INTRAMUSCULAR | Status: DC | PRN

## 2016-06-05 MED ORDER — GUAIFENESIN-DM 100-10 MG/5ML PO SYRP: 10.0000 mL | ORAL_SOLUTION | ORAL | Status: DC | PRN

## 2016-06-05 MED ORDER — ACETAMINOPHEN 325 MG PO TABS
650.0000 mg | ORAL_TABLET | ORAL | Status: DC | PRN
  Administered 2016-06-05: 650 mg via ORAL
  Filled 2016-06-05 (×2): qty 2

## 2016-06-05 MED ORDER — CEFAZOLIN SODIUM-DEXTROSE 2-4 GM/100ML-% IV SOLN
2.0000 g | Freq: Once | INTRAVENOUS | Status: DC
  Filled 2016-06-05: qty 100

## 2016-06-05 MED ORDER — ONDANSETRON HCL 4 MG/2ML IJ SOLN
4.0000 mg | Freq: Four times a day (QID) | INTRAMUSCULAR | Status: DC | PRN
  Administered 2016-06-05: 4 mg via INTRAVENOUS
  Filled 2016-06-05 (×2): qty 2

## 2016-06-05 MED ORDER — MORPHINE SULFATE (PF) 2 MG/ML IV SOLN
1.0000 mg | INTRAVENOUS | Status: DC | PRN
  Administered 2016-06-05 (×4): 4 mg via INTRAVENOUS
  Filled 2016-06-05 (×4): qty 2

## 2016-06-05 MED ORDER — ONDANSETRON HCL 4 MG PO TABS
4.0000 mg | ORAL_TABLET | Freq: Four times a day (QID) | ORAL | Status: DC | PRN
Start: 2016-06-05 — End: 2016-06-09

## 2016-06-05 MED ORDER — ONDANSETRON HCL 4 MG/2ML IJ SOLN: 4.0000 mg | Freq: Once | INTRAMUSCULAR | Status: DC | PRN

## 2016-06-05 MED ORDER — METHOCARBAMOL 1000 MG/10ML IJ SOLN: 500.0000 mg | Freq: Four times a day (QID) | INTRAVENOUS | Status: DC | PRN

## 2016-06-05 MED ORDER — POTASSIUM CHLORIDE IN NACL 20-0.9 MEQ/L-% IV SOLN
INTRAVENOUS | Status: DC
  Administered 2016-06-05 – 2016-06-07 (×3): via INTRAVENOUS
  Filled 2016-06-05 (×3): qty 1000

## 2016-06-05 MED ORDER — OXYCODONE HCL 5 MG PO TABS
10.0000 mg | ORAL_TABLET | ORAL | Status: DC | PRN
  Administered 2016-06-05 – 2016-06-09 (×9): 10 mg via ORAL
  Filled 2016-06-05 (×9): qty 2

## 2016-06-05 MED ORDER — PROMETHAZINE HCL 25 MG/ML IJ SOLN: 12.5000 mg | Freq: Four times a day (QID) | INTRAMUSCULAR | Status: DC | PRN

## 2016-06-05 MED ORDER — MENTHOL 3 MG MT LOZG: 1.0000 | LOZENGE | OROMUCOSAL | Status: DC | PRN

## 2016-06-05 MED ORDER — FENTANYL CITRATE (PF) 100 MCG/2ML IJ SOLN
INTRAMUSCULAR | Status: AC
  Filled 2016-06-05: qty 2

## 2016-06-05 MED ORDER — ENOXAPARIN SODIUM 40 MG/0.4ML ~~LOC~~ SOLN: 40.0000 mg | SUBCUTANEOUS | Status: DC

## 2016-06-05 MED ORDER — CEFAZOLIN IN D5W 1 GM/50ML IV SOLN
1.0000 g | Freq: Four times a day (QID) | INTRAVENOUS | Status: AC
  Administered 2016-06-05 – 2016-06-06 (×3): 1 g via INTRAVENOUS
  Filled 2016-06-05 (×3): qty 50

## 2016-06-05 MED ORDER — OXYCODONE-ACETAMINOPHEN 5-325 MG PO TABS
1.0000 | ORAL_TABLET | Freq: Four times a day (QID) | ORAL | Status: DC | PRN
  Administered 2016-06-06 – 2016-06-09 (×12): 2 via ORAL
  Filled 2016-06-05 (×12): qty 2

## 2016-06-05 MED ORDER — MEPERIDINE HCL 25 MG/ML IJ SOLN: 6.2500 mg | INTRAMUSCULAR | Status: DC | PRN

## 2016-06-05 MED ORDER — ACETAMINOPHEN 650 MG RE SUPP: 650.0000 mg | Freq: Four times a day (QID) | RECTAL | Status: DC | PRN

## 2016-06-05 MED ORDER — DOCUSATE SODIUM 100 MG PO CAPS
100.0000 mg | ORAL_CAPSULE | Freq: Two times a day (BID) | ORAL | Status: DC
  Administered 2016-06-05: 100 mg via ORAL

## 2016-06-05 MED ORDER — OXYCODONE HCL 5 MG PO TABS: 5.0000 mg | ORAL_TABLET | Freq: Once | ORAL | Status: DC | PRN

## 2016-06-05 MED ORDER — LIDOCAINE HCL (CARDIAC) 20 MG/ML IV SOLN
INTRAVENOUS | Status: DC | PRN
  Administered 2016-06-05: 80 mg via INTRATRACHEAL

## 2016-06-05 MED ORDER — DOCUSATE SODIUM 100 MG PO CAPS
100.0000 mg | ORAL_CAPSULE | Freq: Two times a day (BID) | ORAL | Status: DC
  Administered 2016-06-05 – 2016-06-09 (×8): 100 mg via ORAL
  Filled 2016-06-05 (×9): qty 1

## 2016-06-05 MED ORDER — ENOXAPARIN SODIUM 40 MG/0.4ML ~~LOC~~ SOLN
40.0000 mg | SUBCUTANEOUS | Status: DC
Start: 2016-06-06 — End: 2016-06-09
  Administered 2016-06-06 – 2016-06-08 (×3): 40 mg via SUBCUTANEOUS
  Filled 2016-06-05 (×4): qty 0.4

## 2016-06-05 MED ORDER — METOCLOPRAMIDE HCL 5 MG PO TABS: 5.0000 mg | ORAL_TABLET | Freq: Three times a day (TID) | ORAL | Status: DC | PRN

## 2016-06-05 MED ORDER — MIDAZOLAM HCL 2 MG/2ML IJ SOLN
INTRAMUSCULAR | Status: DC | PRN
  Administered 2016-06-05: 2 mg via INTRAVENOUS

## 2016-06-05 MED ORDER — HYDROMORPHONE HCL 1 MG/ML IJ SOLN
0.2500 mg | INTRAMUSCULAR | Status: DC | PRN
  Administered 2016-06-05 (×2): 0.5 mg via INTRAVENOUS

## 2016-06-05 MED ORDER — METHOCARBAMOL 500 MG PO TABS
500.0000 mg | ORAL_TABLET | Freq: Four times a day (QID) | ORAL | Status: DC | PRN
  Administered 2016-06-06: 1000 mg via ORAL
  Administered 2016-06-06: 500 mg via ORAL
  Administered 2016-06-07 – 2016-06-08 (×8): 1000 mg via ORAL
  Administered 2016-06-09: 500 mg via ORAL
  Administered 2016-06-09: 1000 mg via ORAL
  Filled 2016-06-05 (×2): qty 2
  Filled 2016-06-05: qty 1
  Filled 2016-06-05 (×8): qty 2
  Filled 2016-06-05: qty 1

## 2016-06-05 MED ORDER — LACTATED RINGERS IV SOLN
INTRAVENOUS | Status: DC
  Administered 2016-06-05: 19:00:00 via INTRAVENOUS

## 2016-06-05 MED ORDER — ROCURONIUM BROMIDE 100 MG/10ML IV SOLN
INTRAVENOUS | Status: DC | PRN
  Administered 2016-06-05: 60 mg via INTRAVENOUS

## 2016-06-05 MED ORDER — FENTANYL CITRATE (PF) 100 MCG/2ML IJ SOLN
INTRAMUSCULAR | Status: DC | PRN
  Administered 2016-06-05: 100 ug via INTRAVENOUS

## 2016-06-05 MED ORDER — POLYETHYLENE GLYCOL 3350 17 G PO PACK
17.0000 g | PACK | Freq: Every day | ORAL | Status: DC
  Administered 2016-06-06 – 2016-06-09 (×4): 17 g via ORAL
  Filled 2016-06-05 (×4): qty 1

## 2016-06-05 MED ORDER — ALUM & MAG HYDROXIDE-SIMETH 200-200-20 MG/5ML PO SUSP: 30.0000 mL | Freq: Four times a day (QID) | ORAL | Status: DC | PRN

## 2016-06-05 MED ORDER — PROPOFOL 10 MG/ML IV BOLUS
INTRAVENOUS | Status: DC | PRN
Start: 2016-06-05 — End: 2016-06-05
  Administered 2016-06-05: 200 mg via INTRAVENOUS

## 2016-06-05 MED ORDER — MIDAZOLAM HCL 2 MG/2ML IJ SOLN
INTRAMUSCULAR | Status: AC
Start: 2016-06-05 — End: 2016-06-05
  Filled 2016-06-05: qty 2

## 2016-06-05 SURGICAL SUPPLY — 49 items
BLADE SURG ROTATE 9660 (MISCELLANEOUS) ×2 IMPLANT
BRUSH SCRUB DISP (MISCELLANEOUS) ×6 IMPLANT
DRAIN CHANNEL 15F RND FF W/TCR (WOUND CARE) ×3 IMPLANT
DRAPE C-ARM 42X72 X-RAY (DRAPES) ×3 IMPLANT
DRAPE C-ARMOR (DRAPES) ×3 IMPLANT
DRAPE IMP U-DRAPE 54X76 (DRAPES) ×3 IMPLANT
DRAPE INCISE IOBAN 66X45 STRL (DRAPES) ×6 IMPLANT
DRAPE LAPAROTOMY TRNSV 102X78 (DRAPE) ×6 IMPLANT
DRAPE U-SHAPE 47X51 STRL (DRAPES) ×6 IMPLANT
DRSG ADAPTIC 3X8 NADH LF (GAUZE/BANDAGES/DRESSINGS) ×3 IMPLANT
DRSG MEPILEX BORDER 4X4 (GAUZE/BANDAGES/DRESSINGS) ×2 IMPLANT
DRSG PAD ABDOMINAL 8X10 ST (GAUZE/BANDAGES/DRESSINGS) ×6 IMPLANT
ELECT REM PT RETURN 9FT ADLT (ELECTROSURGICAL) ×3
ELECTRODE REM PT RTRN 9FT ADLT (ELECTROSURGICAL) ×1 IMPLANT
EVACUATOR SILICONE 100CC (DRAIN) ×3 IMPLANT
GAUZE SPONGE 4X4 12PLY STRL (GAUZE/BANDAGES/DRESSINGS) ×6 IMPLANT
GLOVE BIO SURGEON STRL SZ7.5 (GLOVE) ×3 IMPLANT
GLOVE BIO SURGEON STRL SZ8 (GLOVE) ×3 IMPLANT
GLOVE BIOGEL PI IND STRL 7.5 (GLOVE) ×1 IMPLANT
GLOVE BIOGEL PI IND STRL 8 (GLOVE) ×1 IMPLANT
GLOVE BIOGEL PI INDICATOR 7.5 (GLOVE) ×2
GLOVE BIOGEL PI INDICATOR 8 (GLOVE) ×2
GOWN STRL REUS W/ TWL LRG LVL3 (GOWN DISPOSABLE) ×2 IMPLANT
GOWN STRL REUS W/ TWL XL LVL3 (GOWN DISPOSABLE) ×1 IMPLANT
GOWN STRL REUS W/TWL LRG LVL3 (GOWN DISPOSABLE) ×6
GOWN STRL REUS W/TWL XL LVL3 (GOWN DISPOSABLE) ×3
KIT BASIN OR (CUSTOM PROCEDURE TRAY) ×3 IMPLANT
KIT ROOM TURNOVER OR (KITS) ×3 IMPLANT
MANIFOLD NEPTUNE II (INSTRUMENTS) ×3 IMPLANT
NS IRRIG 1000ML POUR BTL (IV SOLUTION) ×6 IMPLANT
PACK TOTAL JOINT (CUSTOM PROCEDURE TRAY) ×3 IMPLANT
PACK UNIVERSAL I (CUSTOM PROCEDURE TRAY) ×3 IMPLANT
PAD ARMBOARD 7.5X6 YLW CONV (MISCELLANEOUS) ×6 IMPLANT
SCREW CANNULATED 7.3X160MM (Screw) ×2 IMPLANT
SPONGE LAP 18X18 X RAY DECT (DISPOSABLE) IMPLANT
STAPLER VISISTAT 35W (STAPLE) ×3 IMPLANT
SUCTION FRAZIER HANDLE 10FR (MISCELLANEOUS) ×2
SUCTION TUBE FRAZIER 10FR DISP (MISCELLANEOUS) ×1 IMPLANT
SUT ETHILON 3 0 PS 1 (SUTURE) ×2 IMPLANT
SUT VIC AB 0 CT1 27 (SUTURE) ×12
SUT VIC AB 0 CT1 27XBRD ANBCTR (SUTURE) ×4 IMPLANT
SUT VIC AB 1 CT1 18XCR BRD 8 (SUTURE) ×2 IMPLANT
SUT VIC AB 1 CT1 8-18 (SUTURE) ×6
SUT VIC AB 2-0 CT1 27 (SUTURE) ×12
SUT VIC AB 2-0 CT1 TAPERPNT 27 (SUTURE) ×4 IMPLANT
TOWEL OR 17X24 6PK STRL BLUE (TOWEL DISPOSABLE) ×3 IMPLANT
TOWEL OR 17X26 10 PK STRL BLUE (TOWEL DISPOSABLE) ×6 IMPLANT
TRAY FOLEY CATH 16FRSI W/METER (SET/KITS/TRAYS/PACK) IMPLANT
WATER STERILE IRR 1000ML POUR (IV SOLUTION) ×12 IMPLANT

## 2016-06-05 NOTE — Anesthesia Preprocedure Evaluation (Addendum)
Anesthesia Evaluation  Patient identified by MRN, date of birth, ID band Patient awake    Reviewed: Allergy & Precautions, NPO status , Patient's Chart, lab work & pertinent test results  Airway Mallampati: I  TM Distance: >3 FB Neck ROM: Full    Dental  (+) Teeth Intact, Dental Advisory Given   Pulmonary Current Smoker,    breath sounds clear to auscultation       Cardiovascular  Rhythm:Regular Rate:Normal     Neuro/Psych    GI/Hepatic   Endo/Other    Renal/GU      Musculoskeletal   Abdominal   Peds  Hematology   Anesthesia Other Findings   Reproductive/Obstetrics                             Anesthesia Physical Anesthesia Plan  ASA: I and emergent  Anesthesia Plan: General   Post-op Pain Management:    Induction: Intravenous  Airway Management Planned: Oral ETT  Additional Equipment:   Intra-op Plan:   Post-operative Plan: Extubation in OR  Informed Consent: I have reviewed the patients History and Physical, chart, labs and discussed the procedure including the risks, benefits and alternatives for the proposed anesthesia with the patient or authorized representative who has indicated his/her understanding and acceptance.   Dental advisory given  Plan Discussed with: CRNA, Anesthesiologist and Surgeon  Anesthesia Plan Comments:        Anesthesia Quick Evaluation

## 2016-06-05 NOTE — ED Notes (Signed)
Attempted report x1. 

## 2016-06-05 NOTE — Anesthesia Postprocedure Evaluation (Signed)
Anesthesia Post Note  Patient: Kristine GarbeChristopher E Crumpacker  Procedure(s) Performed: Procedure(s) (LRB): OPEN REDUCTION INTERNAL FIXATION (ORIF) PELVIC FRACTURE (N/A)  Patient location during evaluation: PACU Anesthesia Type: General Level of consciousness: awake and alert Pain management: pain level controlled Vital Signs Assessment: post-procedure vital signs reviewed and stable Respiratory status: spontaneous breathing, nonlabored ventilation, respiratory function stable and patient connected to nasal cannula oxygen Cardiovascular status: blood pressure returned to baseline and stable Postop Assessment: no signs of nausea or vomiting Anesthetic complications: no    Last Vitals:  Vitals:   06/05/16 0453 06/05/16 1829  BP: 123/89   Pulse: 92   Resp: 16   Temp: 37 C 36.9 C    Last Pain:  Vitals:   06/05/16 1829  TempSrc:   PainSc: 0-No pain                 Jadalee Westcott A

## 2016-06-05 NOTE — Transfer of Care (Signed)
Immediate Anesthesia Transfer of Care Note  Patient: Jesse GarbeChristopher E Pastrana  Procedure(s) Performed: Procedure(s): OPEN REDUCTION INTERNAL FIXATION (ORIF) PELVIC FRACTURE (N/A)  Patient Location: PACU  Anesthesia Type:General  Level of Consciousness: sedated  Airway & Oxygen Therapy: Patient Spontanous Breathing and Patient connected to nasal cannula oxygen  Post-op Assessment: Report given to RN and Post -op Vital signs reviewed and stable  Post vital signs: Reviewed and stable  Last Vitals:  Vitals:   06/05/16 0453 06/05/16 1829  BP: 123/89   Pulse: 92   Resp: 16   Temp: 37 C (P) 36.9 C    Last Pain:  Vitals:   06/05/16 0539  TempSrc:   PainSc: Asleep         Complications: No apparent anesthesia complications

## 2016-06-05 NOTE — Progress Notes (Signed)
Orthopedic Tech Progress Note Patient Details:  Jesse [name] [birth year] [id number]  Ortho Devices Ortho Device/Splint Location: applied overhead frame to bed Ortho Device/Splint Interventions: Ordered, Application   Jennye Moccasin 06/05/2016, 7:37 PM
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
## 2016-06-05 NOTE — Anesthesia Procedure Notes (Signed)
Date/Time: 06/05/2016 5:10 PM Performed by: Isidore MoosPAXTON, Hitomi Slape A Patient Re-evaluated:Patient Re-evaluated prior to inductionOxygen Delivery Method: Circle system utilized Preoxygenation: Pre-oxygenation with 100% oxygen Intubation Type: IV induction Ventilation: Mask ventilation without difficulty Laryngoscope Size: Miller and 2 Grade View: Grade I Tube type: Oral Tube size: 7.5 mm Number of attempts: 1 Airway Equipment and Method: Stylet Placement Confirmation: ETT inserted through vocal cords under direct vision,  positive ETCO2 and breath sounds checked- equal and bilateral Secured at: 22 cm Tube secured with: Tape Dental Injury: Teeth and Oropharynx as per pre-operative assessment

## 2016-06-06 ENCOUNTER — Encounter (HOSPITAL_COMMUNITY): Payer: Self-pay | Admitting: General Practice

## 2016-06-06 LAB — POCT I-STAT, CHEM 8
BUN: 15 mg/dL (ref 6–20)
CALCIUM ION: 1.08 mmol/L — AB (ref 1.15–1.40)
CHLORIDE: 104 mmol/L (ref 101–111)
Creatinine, Ser: 1 mg/dL (ref 0.61–1.24)
GLUCOSE: 94 mg/dL (ref 65–99)
HCT: 44 % (ref 39.0–52.0)
HEMOGLOBIN: 15 g/dL (ref 13.0–17.0)
Potassium: 6.6 mmol/L (ref 3.5–5.1)
SODIUM: 139 mmol/L (ref 135–145)
TCO2: 28 mmol/L (ref 0–100)

## 2016-06-06 LAB — CBC
HCT: 40.3 % (ref 39.0–52.0)
Hemoglobin: 13.2 g/dL (ref 13.0–17.0)
MCH: 29.5 pg (ref 26.0–34.0)
MCHC: 32.8 g/dL (ref 30.0–36.0)
MCV: 90 fL (ref 78.0–100.0)
PLATELETS: 202 10*3/uL (ref 150–400)
RBC: 4.48 MIL/uL (ref 4.22–5.81)
RDW: 13.4 % (ref 11.5–15.5)
WBC: 11.3 10*3/uL — ABNORMAL HIGH (ref 4.0–10.5)

## 2016-06-06 LAB — BASIC METABOLIC PANEL
ANION GAP: 7 (ref 5–15)
BUN: 6 mg/dL (ref 6–20)
CO2: 26 mmol/L (ref 22–32)
Calcium: 8.3 mg/dL — ABNORMAL LOW (ref 8.9–10.3)
Chloride: 103 mmol/L (ref 101–111)
Creatinine, Ser: 0.83 mg/dL (ref 0.61–1.24)
GFR calc non Af Amer: >60 mL/min (ref >60)
Glucose, Bld: 111 mg/dL — ABNORMAL HIGH (ref 65–99)
POTASSIUM: 4 mmol/L (ref 3.5–5.1)
SODIUM: 136 mmol/L (ref 135–145)

## 2016-06-06 MED ORDER — WARFARIN - PHARMACIST DOSING INPATIENT
Freq: Every day | Status: DC
  Administered 2016-06-08: 18:00:00

## 2016-06-06 MED ORDER — WARFARIN VIDEO
Freq: Once | Status: AC
  Administered 2016-06-07: 09:00:00

## 2016-06-06 MED ORDER — PNEUMOCOCCAL VAC POLYVALENT 25 MCG/0.5ML IJ INJ
0.5000 mL | INJECTION | INTRAMUSCULAR | Status: AC
Start: 2016-06-07 — End: 2016-06-08
  Administered 2016-06-08: 0.5 mL via INTRAMUSCULAR
  Filled 2016-06-06: qty 0.5

## 2016-06-06 MED ORDER — COUMADIN BOOK
Freq: Once | Status: AC
  Administered 2016-06-06: 17:00:00
  Filled 2016-06-06: qty 1

## 2016-06-06 MED ORDER — WARFARIN SODIUM 7.5 MG PO TABS
7.5000 mg | ORAL_TABLET | Freq: Once | ORAL | Status: AC
  Administered 2016-06-06: 7.5 mg via ORAL
  Filled 2016-06-06: qty 1
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
## 2016-06-07 LAB — BASIC METABOLIC PANEL
Anion gap: 6 (ref 5–15)
BUN: <5 mg/dL — ABNORMAL LOW (ref 6–20)
CHLORIDE: 104 mmol/L (ref 101–111)
CO2: 29 mmol/L (ref 22–32)
CREATININE: 0.75 mg/dL (ref 0.61–1.24)
Calcium: 8.3 mg/dL — ABNORMAL LOW (ref 8.9–10.3)
Glucose, Bld: 111 mg/dL — ABNORMAL HIGH (ref 65–99)
POTASSIUM: 4 mmol/L (ref 3.5–5.1)
SODIUM: 139 mmol/L (ref 135–145)

## 2016-06-07 LAB — CBC
HCT: 39.8 % (ref 39.0–52.0)
Hemoglobin: 13.2 g/dL (ref 13.0–17.0)
MCH: 29.7 pg (ref 26.0–34.0)
MCHC: 33.2 g/dL (ref 30.0–36.0)
MCV: 89.4 fL (ref 78.0–100.0)
PLATELETS: 197 10*3/uL (ref 150–400)
RBC: 4.45 MIL/uL (ref 4.22–5.81)
RDW: 13.3 % (ref 11.5–15.5)
WBC: 10.1 10*3/uL (ref 4.0–10.5)

## 2016-06-07 LAB — PROTIME-INR
INR: 1.15
PROTHROMBIN TIME: 14.8 s (ref 11.4–15.2)

## 2016-06-07 MED ORDER — HYDROMORPHONE HCL 2 MG/ML IJ SOLN
1.0000 mg | INTRAMUSCULAR | Status: DC | PRN
  Administered 2016-06-07 – 2016-06-09 (×2): 1 mg via INTRAVENOUS
  Filled 2016-06-07 (×2): qty 1

## 2016-06-07 MED ORDER — WARFARIN SODIUM 7.5 MG PO TABS
7.5000 mg | ORAL_TABLET | Freq: Once | ORAL | Status: AC
  Administered 2016-06-07: 7.5 mg via ORAL
  Filled 2016-06-07: qty 1
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
## 2016-06-08 LAB — BASIC METABOLIC PANEL
Anion gap: 8 (ref 5–15)
BUN: 5 mg/dL — AB (ref 6–20)
CHLORIDE: 102 mmol/L (ref 101–111)
CO2: 27 mmol/L (ref 22–32)
CREATININE: 0.87 mg/dL (ref 0.61–1.24)
Calcium: 8.7 mg/dL — ABNORMAL LOW (ref 8.9–10.3)
GFR calc Af Amer: >60 mL/min (ref >60)
GFR calc non Af Amer: >60 mL/min (ref >60)
Glucose, Bld: 137 mg/dL — ABNORMAL HIGH (ref 65–99)
POTASSIUM: 4.1 mmol/L (ref 3.5–5.1)
Sodium: 137 mmol/L (ref 135–145)

## 2016-06-08 LAB — PROTIME-INR
INR: 1.9
Prothrombin Time: 22.1 seconds — ABNORMAL HIGH (ref 11.4–15.2)

## 2016-06-08 MED ORDER — WARFARIN SODIUM 4 MG PO TABS
4.0000 mg | ORAL_TABLET | Freq: Once | ORAL | Status: AC
  Administered 2016-06-08: 4 mg via ORAL
  Filled 2016-06-08: qty 1

## 2016-06-08 MED ORDER — FLEET ENEMA 7-19 GM/118ML RE ENEM: 1.0000 | ENEMA | Freq: Once | RECTAL | Status: DC

## 2016-06-08 MED ORDER — BISACODYL 10 MG RE SUPP
10.0000 mg | Freq: Once | RECTAL | Status: AC
Start: 2016-06-08 — End: 2016-06-08
  Administered 2016-06-08: 10 mg via RECTAL
  Filled 2016-06-08: qty 1
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
## 2016-06-08 NOTE — Care Management (Signed)
Case manager received call from [facility] representative, stating patient has been setup with [facility], SOC date 06/10/16. Case manager will fax orders, demographics and OP notes to [facility] @v  [phone], the contact phone # is [phone]. Patient has received Wheelchair and 3in1, will not get rolling walker, his wife states they will get one.
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
## 2016-06-09 ENCOUNTER — Encounter (HOSPITAL_COMMUNITY): Payer: Self-pay | Admitting: *Deleted

## 2016-06-09 DIAGNOSIS — S32810A Multiple fractures of pelvis with stable disruption of pelvic ring, initial encounter for closed fracture: Secondary | ICD-10-CM | POA: Diagnosis present

## 2016-06-09 LAB — PROTIME-INR
INR: 2.74
Prothrombin Time: 29.5 seconds — ABNORMAL HIGH (ref 11.4–15.2)

## 2016-06-09 MED ORDER — OXYCODONE-ACETAMINOPHEN 5-325 MG PO TABS: 1.0000 | ORAL_TABLET | Freq: Four times a day (QID) | ORAL | 0 refills | Status: DC | PRN

## 2016-06-09 MED ORDER — DOCUSATE SODIUM 100 MG PO CAPS: 100.0000 mg | ORAL_CAPSULE | Freq: Two times a day (BID) | ORAL | 0 refills | Status: DC

## 2016-06-09 MED ORDER — WARFARIN SODIUM 2 MG PO TABS
4.0000 mg | ORAL_TABLET | Freq: Every day | ORAL | 1 refills | Status: DC
Start: 2016-06-09 — End: 2017-03-30

## 2016-06-09 MED ORDER — POLYETHYLENE GLYCOL 3350 17 G PO PACK: 17.0000 g | PACK | Freq: Every day | ORAL | 0 refills | Status: DC

## 2016-06-09 MED ORDER — METHOCARBAMOL 500 MG PO TABS: 500.0000 mg | ORAL_TABLET | Freq: Four times a day (QID) | ORAL | 1 refills | Status: DC | PRN

## 2016-06-09 MED ORDER — OXYCODONE HCL 5 MG PO TABS: 5.0000 mg | ORAL_TABLET | Freq: Four times a day (QID) | ORAL | 0 refills | Status: DC | PRN

## 2016-06-09 NOTE — Progress Notes (Addendum)
Orthopaedic Trauma Service (OTS)   Subjective: Patient reports pain as mild.  +BM  Objective: Temp:  [98.1 F (36.7 C)-99 F (37.2 C)] 98.1 F (36.7 C) (10/19 0435) Pulse Rate:  [79-86] 79 (10/19 0435) Resp:  [16-18] 16 (10/19 0435) BP: (120-125)/(67-81) 125/81 (10/19 0435) SpO2:  [96 %-98 %] 97 % (10/19 0435) Physical Exam R&LLE Dressing intact, clean, dry  Edema/ swelling controlled  Sens: DPN, SPN, TN intact  Motor: EHL, FHL, and lessor toe ext and flex all intact grossly  Brisk cap refill, warm to touch  Assessment/Plan: 4 Days Post-Op Procedure(s) (LRB): OPEN REDUCTION INTERNAL FIXATION (ORIF) PELVIC FRACTURE (N/A) 1. D/c now 2. NWB LLE, WBAT on RLE  3. Percocet 4. Coumadin 5. F/u 10-14 days  Myrene GalasMichael Jamileth Putzier, MD [facility] [phone] (p)
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
## 2016-06-09 NOTE — Discharge Instructions (Signed)
Orthopaedic Trauma Service Discharge Instructions   General Discharge Instructions  WEIGHT BEARING STATUS: Nonweightbearing left leg, weightbearing as tolerated right leg  RANGE OF MOTION/ACTIVITY: range of motion as tolerated both hips, knees and ankles   Wound Care: daily dressing changes as needed, see wound care instructions below.    Discharge Wound Care Instructions  Do NOT apply any ointments, solutions or lotions to pin sites or surgical wounds.  These prevent needed drainage and even though solutions like hydrogen peroxide kill bacteria, they also damage cells lining the pin sites that help fight infection.  Applying lotions or ointments can keep the wounds moist and can cause them to breakdown and open up as well. This can increase the risk for infection. When in doubt call the office.  Surgical incisions should be dressed daily.  If any drainage is noted, use one layer of adaptic, then gauze, Kerlix, and an ace wrap.  Once the incision is completely dry and without drainage, it may be left open to air out.  Showering may begin 36-48 hours later.  Cleaning gently with soap and water.  Traumatic wounds should be dressed daily as well.    One layer of adaptic, gauze, Kerlix, then ace wrap.  The adaptic can be discontinued once the draining has ceased    If you have a wet to dry dressing: wet the gauze with saline the squeeze as much saline out so the gauze is moist (not soaking wet), place moistened gauze over wound, then place a dry gauze over the moist one, followed by Kerlix wrap, then ace wrap.    PAIN MEDICATION USE AND EXPECTATIONS  You have likely been given narcotic medications to help control your pain.  After a traumatic event that results in an fracture (broken bone) with or without surgery, it is ok to use narcotic pain medications to help control one's pain.  We understand that everyone responds to pain differently and each individual patient will be evaluated on a  regular basis for the continued need for narcotic medications. Ideally, narcotic medication use should last no more than 6-8 weeks (coinciding with fracture healing).   As a patient it is your responsibility as well to monitor narcotic medication use and report the amount and frequency you use these medications when you come to your office visit.   We would also advise that if you are using narcotic medications, you should take a dose prior to therapy to maximize you participation.  IF YOU ARE ON NARCOTIC MEDICATIONS IT IS NOT PERMISSIBLE TO OPERATE A MOTOR VEHICLE (MOTORCYCLE/CAR/TRUCK/MOPED) OR HEAVY MACHINERY DO NOT MIX NARCOTICS WITH OTHER CNS (CENTRAL NERVOUS SYSTEM) DEPRESSANTS SUCH AS ALCOHOL  Diet: as you were eating previously.  Can use over the counter stool softeners and bowel preparations, such as Miralax, to help with bowel movements.  Narcotics can be constipating.  Be sure to drink plenty of fluids    STOP SMOKING OR USING NICOTINE PRODUCTS!!!!  As discussed nicotine severely impairs your body's ability to heal surgical and traumatic wounds but also impairs bone healing.  Wounds and bone heal by forming microscopic blood vessels (angiogenesis) and nicotine is a vasoconstrictor (essentially, shrinks blood vessels).  Therefore, if vasoconstriction occurs to these microscopic blood vessels they essentially disappear and are unable to deliver necessary nutrients to the healing tissue.  This is one modifiable factor that you can do to dramatically increase your chances of healing your injury.    (This means no smoking, no nicotine gum, patches, etc)  DO NOT USE NONSTEROIDAL ANTI-INFLAMMATORY DRUGS (NSAID'S)  Using products such as Advil (ibuprofen), Aleve (naproxen), Motrin (ibuprofen) for additional pain control during fracture healing can delay and/or prevent the healing response.  If you would like to take over the counter (OTC) medication, Tylenol (acetaminophen) is ok.  However, some  narcotic medications that are given for pain control contain acetaminophen as well. Therefore, you should not exceed more than 4000 mg of tylenol in a day if you do not have liver disease.  Also note that there are may OTC medicines, such as cold medicines and allergy medicines that my contain tylenol as well.  If you have any questions about medications and/or interactions please ask your doctor/PA or your pharmacist.      ICE AND ELEVATE INJURED/OPERATIVE EXTREMITY  Using ice and elevating the injured extremity above your heart can help with swelling and pain control.  Icing in a pulsatile fashion, such as 20 minutes on and 20 minutes off, can be followed.    Do not place ice directly on skin. Make sure there is a barrier between to skin and the ice pack.    Using frozen items such as frozen peas works well as the conform nicely to the are that needs to be iced.  USE AN ACE WRAP OR TED HOSE FOR SWELLING CONTROL  In addition to icing and elevation, Ace wraps or TED hose are used to help limit and resolve swelling.  It is recommended to use Ace wraps or TED hose until you are informed to stop.    When using Ace Wraps start the wrapping distally (farthest away from the body) and wrap proximally (closer to the body)   Example: If you had surgery on your leg or thing and you do not have a splint on, start the ace wrap at the toes and work your way up to the thigh        If you had surgery on your upper extremity and do not have a splint on, start the ace wrap at your fingers and work your way up to the upper arm  IF YOU ARE IN A SPLINT OR CAST DO NOT REMOVE IT FOR ANY REASON   If your splint gets wet for any reason please contact the office immediately. You may shower in your splint or cast as long as you keep it dry.  This can be done by wrapping in a cast cover or garbage back (or similar)  Do Not stick any thing down your splint or cast such as pencils, money, or hangers to try and scratch yourself  with.  If you feel itchy take benadryl as prescribed on the bottle for itching  IF YOU ARE IN A CAM BOOT (BLACK BOOT)  You may remove boot periodically. Perform daily dressing changes as noted below.  Wash the liner of the boot regularly and wear a sock when wearing the boot. It is recommended that you sleep in the boot until told otherwise  CALL THE OFFICE WITH ANY QUESTIONS OR CONCERNS: [phone]      Information on my medicine - Coumadin   (Warfarin)  This medication education was reviewed with me or my healthcare representative as part of my discharge preparation.  The pharmacist that spoke with me during my hospital stay was:  Archie Pattenonya, RPH  Why was Coumadin prescribed for you? Coumadin was prescribed for you because you have a blood clot or a medical condition that can cause an increased risk of forming blood  clots. Blood clots can cause serious health problems by blocking the flow of blood to the heart, lung, or brain. Coumadin can prevent harmful blood clots from forming. As a reminder your indication for Coumadin is:   Blood Clot Prevention After Orthopedic Surgery  What test will check on my response to Coumadin? While on Coumadin (warfarin) you will need to have an INR test regularly to ensure that your dose is keeping you in the desired range. The INR (international normalized ratio) number is calculated from the result of the laboratory test called prothrombin time (PT).  If an INR APPOINTMENT HAS NOT ALREADY BEEN MADE FOR YOU please schedule an appointment to have this lab work done by your health care provider within 7 days. Your INR goal is usually a number between:  2 to 3 or your provider may give you a more narrow range like 2-2.5.  Ask your health care provider during an office visit what your goal INR is.  What  do you need to  know  About  COUMADIN? Take Coumadin (warfarin) exactly as prescribed by your healthcare provider about the same time each day.  DO NOT stop  taking without talking to the doctor who prescribed the medication.  Stopping without other blood clot prevention medication to take the place of Coumadin may increase your risk of developing a new clot or stroke.  Get refills before you run out.  What do you do if you miss a dose? If you miss a dose, take it as soon as you remember on the same day then continue your regularly scheduled regimen the next day.  Do not take two doses of Coumadin at the same time.  Important Safety Information A possible side effect of Coumadin (Warfarin) is an increased risk of bleeding. You should call your healthcare provider right away if you experience any of the following: ? Bleeding from an injury or your nose that does not stop. ? Unusual colored urine (red or dark brown) or unusual colored stools (red or black). ? Unusual bruising for unknown reasons. ? A serious fall or if you hit your head (even if there is no bleeding).  Some foods or medicines interact with Coumadin (warfarin) and might alter your response to warfarin. To help avoid this: ? Eat a balanced diet, maintaining a consistent amount of Vitamin K. ? Notify your provider about major diet changes you plan to make. ? Avoid alcohol or limit your intake to 1 drink for women and 2 drinks for men per day. (1 drink is 5 oz. wine, 12 oz. beer, or 1.5 oz. liquor.)  Make sure that ANY health care provider who prescribes medication for you knows that you are taking Coumadin (warfarin).  Also make sure the healthcare provider who is monitoring your Coumadin knows when you have started a new medication including herbals and non-prescription products.  Coumadin (Warfarin)  Major Drug Interactions  Increased Warfarin Effect Decreased Warfarin Effect  Alcohol (large quantities) Antibiotics (esp. Septra/Bactrim, Flagyl, Cipro) Amiodarone (Cordarone) Aspirin (ASA) Cimetidine (Tagamet) Megestrol (Megace) NSAIDs (ibuprofen, naproxen, etc.) Piroxicam  (Feldene) Propafenone (Rythmol SR) Propranolol (Inderal) Isoniazid (INH) Posaconazole (Noxafil) Barbiturates (Phenobarbital) Carbamazepine (Tegretol) Chlordiazepoxide (Librium) Cholestyramine (Questran) Griseofulvin Oral Contraceptives Rifampin Sucralfate (Carafate) Vitamin K   Coumadin (Warfarin) Major Herbal Interactions  Increased Warfarin Effect Decreased Warfarin Effect  Garlic Ginseng Ginkgo biloba Coenzyme Q10 Green tea St. Johns wort    Coumadin (Warfarin) FOOD Interactions  Eat a consistent number of servings per week of foods HIGH  in Vitamin K (1 serving =  cup)  Collards (cooked, or boiled & drained) Kale (cooked, or boiled & drained) Mustard greens (cooked, or boiled & drained) Parsley *serving size only =  cup Spinach (cooked, or boiled & drained) Swiss chard (cooked, or boiled & drained) Turnip greens (cooked, or boiled & drained)  Eat a consistent number of servings per week of foods MEDIUM-HIGH in Vitamin K (1 serving = 1 cup)  Asparagus (cooked, or boiled & drained) Broccoli (cooked, boiled & drained, or raw & chopped) Brussel sprouts (cooked, or boiled & drained) *serving size only =  cup Lettuce, raw (green leaf, endive, romaine) Spinach, raw Turnip greens, raw & chopped   These websites have more information on Coumadin (warfarin):  FailFactory.se; VeganReport.com.au;

## 2016-06-09 NOTE — Progress Notes (Signed)
[facility] (OTS)   Subjective: Patient reports pain as moderate.  Unable to tolerate transfers well and does not believe safe for home but anticipates being so tomorrow.  Objective: Temp:  [98.1 F (36.7 C)-99 F (37.2 C)] 98.1 F (36.7 C) (10/19 0435) Pulse Rate:  [79-86] 79 (10/19 0435) Resp:  [16-18] 16 (10/19 0435) BP: (120-125)/(67-81) 125/81 (10/19 0435) SpO2:  [96 %-98 %] 97 % (10/19 0435) Physical Exam R&LLE Dressing intact, clean, dry  Edema/ swelling controlled  Sens: DPN, SPN, TN intact  Motor: EHL, FHL, and lessor toe ext and flex all intact grossly  Brisk cap refill, warm to touch   Assessment/Plan: 4 Days Post-Op Procedure(s) (LRB): OPEN REDUCTION INTERNAL FIXATION (ORIF) PELVIC FRACTURE (N/A)  1. D/c tomorrow 2. NWB LLE, WBAT on RLE  3. Percocet 4. Lovenox/ coumadin   Myrene GalasMichael Jo Booze, MD [facility] [phone] (p)

## 2016-06-09 NOTE — Progress Notes (Signed)
Pt discharged to home with spouse via wheelchair without incident per MD order. Prior to discharge, all d/c teachings done both written and verbal with pt and spouse. All questions answered. Per Marisue HumbleKendra Hiatt pharmacist, pt and wife aware to hold coumadin today and have home health check PT/INR tomorrow and based on that result take coumadin per their recommendation. No change in AM assessment of pt prior to discharge. Pt pain controlled on discharge.

## 2016-06-10 ENCOUNTER — Encounter (HOSPITAL_COMMUNITY): Payer: Self-pay
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
## 2016-07-31 ENCOUNTER — Encounter (HOSPITAL_COMMUNITY): Payer: Self-pay | Admitting: Emergency Medicine

## 2016-07-31 ENCOUNTER — Emergency Department (HOSPITAL_COMMUNITY): Payer: Managed Care, Other (non HMO)

## 2016-07-31 ENCOUNTER — Emergency Department (HOSPITAL_COMMUNITY)
Admission: EM | Admit: 2016-07-31 | Discharge: 2016-07-31 | Disposition: A | Discharge status: Home / Self Care | Payer: Managed Care, Other (non HMO) | Attending: Emergency Medicine | Admitting: Emergency Medicine

## 2016-07-31 DIAGNOSIS — Z79899 Other long term (current) drug therapy: Secondary | ICD-10-CM | POA: Insufficient documentation

## 2016-07-31 DIAGNOSIS — F17210 Nicotine dependence, cigarettes, uncomplicated: Secondary | ICD-10-CM | POA: Diagnosis not present

## 2016-07-31 DIAGNOSIS — R071 Chest pain on breathing: Secondary | ICD-10-CM | POA: Diagnosis present

## 2016-07-31 DIAGNOSIS — Z7901 Long term (current) use of anticoagulants: Secondary | ICD-10-CM | POA: Insufficient documentation

## 2016-07-31 DIAGNOSIS — R079 Chest pain, unspecified: Secondary | ICD-10-CM

## 2016-07-31 LAB — BASIC METABOLIC PANEL
Anion gap: 8 (ref 5–15)
BUN: 10 mg/dL (ref 6–20)
CO2: 27 mmol/L (ref 22–32)
CREATININE: 0.83 mg/dL (ref 0.61–1.24)
Calcium: 9.3 mg/dL (ref 8.9–10.3)
Chloride: 104 mmol/L (ref 101–111)
Glucose, Bld: 118 mg/dL — ABNORMAL HIGH (ref 65–99)
POTASSIUM: 4 mmol/L (ref 3.5–5.1)
SODIUM: 139 mmol/L (ref 135–145)

## 2016-07-31 LAB — CBC
HCT: 42.8 % (ref 39.0–52.0)
Hemoglobin: 14.2 g/dL (ref 13.0–17.0)
MCH: 29.7 pg (ref 26.0–34.0)
MCHC: 33.2 g/dL (ref 30.0–36.0)
MCV: 89.5 fL (ref 78.0–100.0)
PLATELETS: 254 10*3/uL (ref 150–400)
RBC: 4.78 MIL/uL (ref 4.22–5.81)
RDW: 13.3 % (ref 11.5–15.5)
WBC: 10.8 10*3/uL — AB (ref 4.0–10.5)

## 2016-07-31 LAB — I-STAT TROPONIN, ED
TROPONIN I, POC: 0 ng/mL (ref 0.00–0.08)
Troponin i, poc: 0 ng/mL (ref 0.00–0.08)

## 2016-07-31 LAB — D-DIMER, QUANTITATIVE (NOT AT ARMC): D DIMER QUANT: 2.72 ug{FEU}/mL — AB (ref 0.00–0.50)

## 2016-07-31 MED ORDER — IOPAMIDOL (ISOVUE-370) INJECTION 76%
INTRAVENOUS | Status: AC
  Administered 2016-07-31: 100 mL
  Filled 2016-07-31: qty 100

## 2016-07-31 MED ORDER — NAPROXEN 500 MG PO TABS: 500.0000 mg | ORAL_TABLET | Freq: Two times a day (BID) | ORAL | 0 refills | Status: DC

## 2016-07-31 MED ORDER — GI COCKTAIL ~~LOC~~
30.0000 mL | Freq: Once | ORAL | Status: AC
  Administered 2016-07-31: 30 mL via ORAL
  Filled 2016-07-31: qty 30

## 2016-07-31 MED ORDER — FENTANYL CITRATE (PF) 100 MCG/2ML IJ SOLN
50.0000 ug | Freq: Once | INTRAMUSCULAR | Status: AC
  Administered 2016-07-31: 50 ug via INTRAVENOUS
  Filled 2016-07-31: qty 2

## 2016-07-31 MED ORDER — MORPHINE SULFATE (PF) 4 MG/ML IV SOLN: 4.0000 mg | Freq: Once | INTRAVENOUS | Status: DC

## 2016-07-31 NOTE — Discharge Instructions (Signed)
Workup today was normal. There is no evidence of cardiac injury or pulmonary embolus. As we discussed, we did find a pulmonary nodule which will need to be monitored over the next few months to ensure this is not increasing in size. Follow-up with your primary care doctor. Return here for any new or worsening symptoms including recurrent pain, shortness of breath, sweating, nausea, vomiting, or weakness.

## 2016-07-31 NOTE — ED Notes (Signed)
Patient transported to X-ray 

## 2016-07-31 NOTE — ED Triage Notes (Signed)
Patient woke up this morning with central chest pain " stabbing" with SOB , occasional dry cough and nausea , denies diaphoresis. No fever or chills .
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
## 2017-03-30 ENCOUNTER — Encounter (HOSPITAL_COMMUNITY): Payer: Self-pay | Admitting: Emergency Medicine

## 2017-03-30 ENCOUNTER — Ambulatory Visit (HOSPITAL_COMMUNITY)
Admission: EM | Admit: 2017-03-30 | Discharge: 2017-03-30 | Disposition: A | Discharge status: Home / Self Care | Payer: Managed Care, Other (non HMO) | Attending: Internal Medicine | Admitting: Internal Medicine

## 2017-03-30 DIAGNOSIS — R1032 Left lower quadrant pain: Secondary | ICD-10-CM | POA: Diagnosis not present

## 2017-03-30 DIAGNOSIS — R310 Gross hematuria: Secondary | ICD-10-CM

## 2017-03-30 LAB — POCT URINALYSIS DIP (DEVICE)
Glucose, UA: NEGATIVE mg/dL
Ketones, ur: NEGATIVE mg/dL
LEUKOCYTES UA: NEGATIVE
NITRITE: NEGATIVE
PH: 6 (ref 5.0–8.0)
Protein, ur: 100 mg/dL — AB
Urobilinogen, UA: 1 mg/dL (ref 0.0–1.0)
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
## 2017-03-30 NOTE — Discharge Instructions (Signed)
Uncertain of diagnosis but likely a kidney stone. Try to see the urologist tomorrow, if not see her primary care provider. If you develop significant pain not relieved with ibuprofen he will need to go to the emergency department promptly.

## 2017-03-30 NOTE — ED Triage Notes (Signed)
Pt c/o blood in urine onset yesterday. Wife is present with him and reports that she also observed the blood in the commode. Pt denies urinary retention or pain. Pt reports pain from left side to groin area.

## 2017-05-23 ENCOUNTER — Other Ambulatory Visit: Payer: Self-pay | Admitting: Urology

## 2017-05-26 ENCOUNTER — Encounter (HOSPITAL_BASED_OUTPATIENT_CLINIC_OR_DEPARTMENT_OTHER): Payer: Self-pay | Admitting: *Deleted

## 2017-05-26 NOTE — Progress Notes (Signed)
NPO AFTER MN, PT VERBALIZED UNDERSTANDING THIS INCLUDED NO DIP TOBACCO.   ARRIVE AT 0630.  NEEDS HG.  WILL TAKE FLOMAX AM DOS W/ SIPS OF WATER AND IF NEEDED MAY TAKE TYLENOL AM DOS W/ SIPS OF WATER.

## 2017-05-30 NOTE — Anesthesia Preprocedure Evaluation (Addendum)
Anesthesia Evaluation  Patient identified by MRN, date of birth, ID band Patient awake    Reviewed: Allergy & Precautions, NPO status , Patient's Chart, lab work & pertinent test results  History of Anesthesia Complications Negative for: history of anesthetic complications  Airway Mallampati: II  TM Distance: >3 FB Neck ROM: Full    Dental no notable dental hx. (+) Dental Advisory Given   Pulmonary sleep apnea , Current Smoker,    Pulmonary exam normal        Cardiovascular negative cardio ROS Normal cardiovascular exam     Neuro/Psych negative neurological ROS  negative psych ROS   GI/Hepatic negative GI ROS, Neg liver ROS,   Endo/Other  negative endocrine ROS  Renal/GU      Musculoskeletal negative musculoskeletal ROS (+)   Abdominal   Peds  Hematology negative hematology ROS (+)   Anesthesia Other Findings Day of surgery medications reviewed with the patient.  Reproductive/Obstetrics                            Anesthesia Physical Anesthesia Plan  ASA: II  Anesthesia Plan: General   Post-op Pain Management:    Induction: Intravenous  PONV Risk Score and Plan: 2 and Ondansetron and Dexamethasone  Airway Management Planned: LMA  Additional Equipment:   Intra-op Plan:   Post-operative Plan: Extubation in OR  Informed Consent: I have reviewed the patients History and Physical, chart, labs and discussed the procedure including the risks, benefits and alternatives for the proposed anesthesia with the patient or authorized representative who has indicated his/her understanding and acceptance.   Dental advisory given  Plan Discussed with: Anesthesiologist, CRNA and Surgeon  Anesthesia Plan Comments: (Smoked to day, was instructed not to smoke.)       Anesthesia Quick Evaluation

## 2017-05-31 ENCOUNTER — Ambulatory Visit (HOSPITAL_BASED_OUTPATIENT_CLINIC_OR_DEPARTMENT_OTHER): Payer: Managed Care, Other (non HMO) | Admitting: Anesthesiology

## 2017-05-31 ENCOUNTER — Encounter (HOSPITAL_BASED_OUTPATIENT_CLINIC_OR_DEPARTMENT_OTHER)
Admission: RE | Disposition: A | Discharge status: Home / Self Care | Payer: Self-pay | Source: Ambulatory Visit | Attending: Urology

## 2017-05-31 ENCOUNTER — Ambulatory Visit (HOSPITAL_BASED_OUTPATIENT_CLINIC_OR_DEPARTMENT_OTHER)
Admission: RE | Admit: 2017-05-31 | Discharge: 2017-05-31 | Disposition: A | Discharge status: Home / Self Care | Payer: Managed Care, Other (non HMO) | Source: Ambulatory Visit | Attending: Urology | Admitting: Urology

## 2017-05-31 ENCOUNTER — Encounter (HOSPITAL_BASED_OUTPATIENT_CLINIC_OR_DEPARTMENT_OTHER): Payer: Self-pay

## 2017-05-31 DIAGNOSIS — F17290 Nicotine dependence, other tobacco product, uncomplicated: Secondary | ICD-10-CM | POA: Diagnosis not present

## 2017-05-31 DIAGNOSIS — Z87442 Personal history of urinary calculi: Secondary | ICD-10-CM | POA: Insufficient documentation

## 2017-05-31 DIAGNOSIS — F17210 Nicotine dependence, cigarettes, uncomplicated: Secondary | ICD-10-CM | POA: Insufficient documentation

## 2017-05-31 DIAGNOSIS — N132 Hydronephrosis with renal and ureteral calculous obstruction: Secondary | ICD-10-CM | POA: Insufficient documentation

## 2017-05-31 DIAGNOSIS — G4733 Obstructive sleep apnea (adult) (pediatric): Secondary | ICD-10-CM | POA: Diagnosis not present

## 2017-05-31 HISTORY — DX: Calculus of ureter: N20.1

## 2017-05-31 HISTORY — DX: Urgency of urination: R39.15

## 2017-05-31 HISTORY — DX: Obstructive sleep apnea (adult) (pediatric): G47.33

## 2017-05-31 HISTORY — DX: Frequency of micturition: R35.0

## 2017-05-31 HISTORY — PX: URETEROSCOPY WITH HOLMIUM LASER LITHOTRIPSY: SHX6645

## 2017-05-31 HISTORY — DX: Personal history of (healed) traumatic fracture: Z87.81

## 2017-05-31 LAB — POCT HEMOGLOBIN-HEMACUE: HEMOGLOBIN: 14.7 g/dL (ref 13.0–17.0)

## 2017-05-31 SURGERY — URETEROSCOPY, WITH LITHOTRIPSY USING HOLMIUM LASER
Anesthesia: General | Site: Ureter | Laterality: Left

## 2017-05-31 MED ORDER — FENTANYL CITRATE (PF) 100 MCG/2ML IJ SOLN
INTRAMUSCULAR | Status: AC
  Filled 2017-05-31: qty 2

## 2017-05-31 MED ORDER — LACTATED RINGERS IV SOLN
INTRAVENOUS | Status: DC
  Administered 2017-05-31: 07:00:00 via INTRAVENOUS
  Administered 2017-05-31: 1000 mL via INTRAVENOUS
  Administered 2017-05-31: 08:00:00 via INTRAVENOUS
  Filled 2017-05-31: qty 1000

## 2017-05-31 MED ORDER — IOHEXOL 300 MG/ML  SOLN
INTRAMUSCULAR | Status: DC | PRN
Start: 2017-05-31 — End: 2017-05-31
  Administered 2017-05-31: 5 mL via URETHRAL

## 2017-05-31 MED ORDER — PROMETHAZINE HCL 25 MG/ML IJ SOLN
6.2500 mg | INTRAMUSCULAR | Status: DC | PRN
  Filled 2017-05-31: qty 1

## 2017-05-31 MED ORDER — DEXAMETHASONE SODIUM PHOSPHATE 10 MG/ML IJ SOLN
INTRAMUSCULAR | Status: AC
  Filled 2017-05-31: qty 1

## 2017-05-31 MED ORDER — ONDANSETRON HCL 4 MG/2ML IJ SOLN
INTRAMUSCULAR | Status: DC | PRN
  Administered 2017-05-31: 4 mg via INTRAVENOUS

## 2017-05-31 MED ORDER — ONDANSETRON HCL 4 MG PO TABS: 4.0000 mg | ORAL_TABLET | Freq: Every day | ORAL | 1 refills | Status: DC | PRN

## 2017-05-31 MED ORDER — PROPOFOL 10 MG/ML IV BOLUS
INTRAVENOUS | Status: AC
  Filled 2017-05-31: qty 40

## 2017-05-31 MED ORDER — KETOROLAC TROMETHAMINE 30 MG/ML IJ SOLN
INTRAMUSCULAR | Status: AC
  Filled 2017-05-31: qty 1

## 2017-05-31 MED ORDER — HYDROCODONE-ACETAMINOPHEN 5-325 MG PO TABS: 1.0000 | ORAL_TABLET | ORAL | 0 refills | Status: DC | PRN

## 2017-05-31 MED ORDER — MIDAZOLAM HCL 2 MG/2ML IJ SOLN
INTRAMUSCULAR | Status: AC
  Filled 2017-05-31: qty 2

## 2017-05-31 MED ORDER — MIDAZOLAM HCL 2 MG/2ML IJ SOLN
INTRAMUSCULAR | Status: DC | PRN
  Administered 2017-05-31: 2 mg via INTRAVENOUS

## 2017-05-31 MED ORDER — CEFAZOLIN SODIUM-DEXTROSE 2-4 GM/100ML-% IV SOLN
INTRAVENOUS | Status: AC
  Filled 2017-05-31: qty 100

## 2017-05-31 MED ORDER — SODIUM CHLORIDE 0.9 % IR SOLN
Status: DC | PRN
  Administered 2017-05-31: 3000 mL
  Administered 2017-05-31: 1000 mL

## 2017-05-31 MED ORDER — SULFAMETHOXAZOLE-TRIMETHOPRIM 800-160 MG PO TABS: 1.0000 | ORAL_TABLET | Freq: Two times a day (BID) | ORAL | 0 refills | Status: AC

## 2017-05-31 MED ORDER — CEFAZOLIN SODIUM-DEXTROSE 2-4 GM/100ML-% IV SOLN
2.0000 g | Freq: Once | INTRAVENOUS | Status: AC
Start: 2017-05-31 — End: 2017-05-31
  Administered 2017-05-31: 2 g via INTRAVENOUS
  Filled 2017-05-31: qty 100

## 2017-05-31 MED ORDER — PROPOFOL 10 MG/ML IV BOLUS
INTRAVENOUS | Status: DC | PRN
  Administered 2017-05-31: 200 mg via INTRAVENOUS

## 2017-05-31 MED ORDER — LIDOCAINE 2% (20 MG/ML) 5 ML SYRINGE
INTRAMUSCULAR | Status: DC | PRN
  Administered 2017-05-31: 80 mg via INTRAVENOUS

## 2017-05-31 MED ORDER — ONDANSETRON HCL 4 MG/2ML IJ SOLN
INTRAMUSCULAR | Status: AC
  Filled 2017-05-31: qty 2

## 2017-05-31 MED ORDER — DEXAMETHASONE SODIUM PHOSPHATE 10 MG/ML IJ SOLN
INTRAMUSCULAR | Status: DC | PRN
  Administered 2017-05-31: 10 mg via INTRAVENOUS

## 2017-05-31 MED ORDER — HYDROMORPHONE HCL 1 MG/ML IJ SOLN
0.2500 mg | INTRAMUSCULAR | Status: DC | PRN
  Filled 2017-05-31: qty 0.5

## 2017-05-31 MED ORDER — LIDOCAINE 2% (20 MG/ML) 5 ML SYRINGE
INTRAMUSCULAR | Status: AC
  Filled 2017-05-31: qty 5

## 2017-05-31 MED ORDER — FENTANYL CITRATE (PF) 100 MCG/2ML IJ SOLN
INTRAMUSCULAR | Status: DC | PRN
  Administered 2017-05-31: 50 ug via INTRAVENOUS

## 2017-05-31 MED ORDER — KETOROLAC TROMETHAMINE 30 MG/ML IJ SOLN
INTRAMUSCULAR | Status: DC | PRN
  Administered 2017-05-31: 30 mg via INTRAVENOUS

## 2017-05-31 SURGICAL SUPPLY — 26 items
APL SKNCLS STERI-STRIP NONHPOA (GAUZE/BANDAGES/DRESSINGS)
BAG DRAIN URO-CYSTO SKYTR STRL (DRAIN) ×3 IMPLANT
BAG DRN UROCATH (DRAIN) ×1
BASKET ZERO TIP NITINOL 2.4FR (BASKET) ×3 IMPLANT
BENZOIN TINCTURE PRP APPL 2/3 (GAUZE/BANDAGES/DRESSINGS) IMPLANT
BSKT STON RTRVL ZERO TP 2.4FR (BASKET) ×1
CATH INTERMIT  6FR 70CM (CATHETERS) ×2 IMPLANT
CLOSURE WOUND 1/2 X4 (GAUZE/BANDAGES/DRESSINGS)
CLOTH BEACON ORANGE TIMEOUT ST (SAFETY) ×3 IMPLANT
FIBER LASER FLEXIVA 365 (UROLOGICAL SUPPLIES) ×2 IMPLANT
GLOVE BIO SURGEON STRL SZ7.5 (GLOVE) ×3 IMPLANT
GOWN STRL REUS W/TWL XL LVL3 (GOWN DISPOSABLE) ×2 IMPLANT
GUIDEWIRE ANG ZIPWIRE 038X150 (WIRE) ×3 IMPLANT
GUIDEWIRE STR DUAL SENSOR (WIRE) ×2 IMPLANT
INFUSOR MANOMETER BAG 3000ML (MISCELLANEOUS) ×3 IMPLANT
IV NS 1000ML (IV SOLUTION) ×3
IV NS 1000ML BAXH (IV SOLUTION) IMPLANT
IV NS IRRIG 3000ML ARTHROMATIC (IV SOLUTION) ×3 IMPLANT
KIT RM TURNOVER CYSTO AR (KITS) ×3 IMPLANT
MANIFOLD NEPTUNE II (INSTRUMENTS) ×3 IMPLANT
PACK CYSTO (CUSTOM PROCEDURE TRAY) ×3 IMPLANT
STENT URET 6FRX26 CONTOUR (STENTS) ×2 IMPLANT
STRIP CLOSURE SKIN 1/2X4 (GAUZE/BANDAGES/DRESSINGS) IMPLANT
SYRINGE 10CC LL (SYRINGE) ×3 IMPLANT
TUBE CONNECTING 12'X1/4 (SUCTIONS) ×1
TUBE CONNECTING 12X1/4 (SUCTIONS) ×1 IMPLANT

## 2017-05-31 NOTE — Anesthesia Postprocedure Evaluation (Signed)
Anesthesia Post Note  Patient: Jesse [name]  Procedure(s) Performed: URETEROSCOPY WITH HOLMIUM LASER LITHOTRIPSY/ RETROGRADE/ STENT PLACEMENT (Left Ureter)     Patient location during evaluation: PACU Anesthesia Type: General Level of consciousness: sedated Pain management: pain level controlled Vital Signs Assessment: post-procedure vital signs reviewed and stable Respiratory status: spontaneous breathing and respiratory function stable Cardiovascular status: stable Postop Assessment: no apparent nausea or vomiting Anesthetic complications: no    Last Vitals:  Vitals:   05/31/17 0930 05/31/17 0945  BP: 128/79 121/73  Pulse: 73 64  Resp: 17 20  Temp:    SpO2: 96% 93%    Last Pain:  Vitals:   05/31/17 0930  TempSrc:   PainSc: 0-No pain                 Irie Fiorello DANIEL

## 2017-05-31 NOTE — Interval H&P Note (Signed)
History and Physical Interval Note:  05/31/2017 8:17 AM  Jesse [name]  has presented today for surgery, with the diagnosis of LEFT URETERAL STONE  The various methods of treatment have been discussed with the patient and family. After consideration of risks, benefits and other options for treatment, the patient has consented to  Procedure(s): URETEROSCOPY WITH HOLMIUM LASER LITHOTRIPSY/ RETROGRADE/ STENT PLACEMENT (Left) as a surgical intervention .  The patient's history has been reviewed, patient examined, no change in status, stable for surgery.  I have reviewed the patient's chart and labs.  Questions were answered to the patient's satisfaction.     Dorian Furnace Grayland Daisey
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
## 2017-05-31 NOTE — Op Note (Addendum)
Operative Note  Preoperative diagnosis:  1. 5 mm left mid-ureteral stone  Postoperative diagnosis: 1. 5 mm left mid-ureteral stone  Procedure(s): 1.  Cystoscopy 2.  Left retrograde pyelogram with intra-operative interpretation of fluoroscopic imaging 3.  Left ureteroscopy 4.  Laser lithotripsy 5.  Left JJ stent placement  Surgeon: Ellison Hughs, MD  Anesthesia: General  Complications: None  EBL: <5 mL  Specimens: 1. Left ureteral stone  Drains/Catheters: 1. Left 6 [place] JJ stent   Intraoperative findings: Obstructing left ureteral stone  Indication: Jesse [name] is a 46 year old male with a 5 mm left mid-ureteral stone seen on CTSS on 03/31/17.  He has persistent left sided flank pain and has failed MET.  He is here for the above procedures  Description of procedure: After informed consent was obtained, the patient was brought to the operating room and general LMA anesthesia was administered. The patient was then placed in the dorsolithotomy position and prepped and draped in usual sterile fashion. A timeout was performed. A 21 French rigid cystoscope was then inserted into the urethral meatus and advanced into the bladder under direct vision. A complete bladder survey revealed no intravesical pathology.  A 6 French open-ended catheter was then inserted into the left ureteral orifice. A retrograde pyelogram was obtained, which showed a filling defect consistent with his 5 mm stone in the left mid ureter. The remainder of the left ureter appeared mildly dilated. There was crisp outlining of all left renal calyces with no other filling defects.  A Glidewire was then inserted through the lumen of the open-ended catheter and was advanced up to the left renal pelvis, under fluoroscopic guidance. The rigid cystoscope was then exchanged for a semirigid ureteroscope. The ureteroscope was navigated into the distal left ureter, where his obstructing stone was identified. A 360   holmium laser was then used to fracture the stone into multiple smaller pieces. A tipples basket was then used to extract all stone fragments from the lumen of the left ureter.  The rigid ureteroscope was then exchanged for the rigid cystoscope, which was advanced over the wire. A 6 [place] JJ stent was then placed over the wire and into good position within the left collecting system. The patient's bladder was then drained and all stone fragments were removed. He tolerated the procedure well and was transferred to the postanesthesia in stable condition.  Plan:  Follow up in one week for office cystoscopy and stent removal

## 2017-05-31 NOTE — Anesthesia Procedure Notes (Signed)
Procedure Name: LMA Insertion Date/Time: 05/31/2017 8:23 AM Performed by: Tyrone Nine Pre-anesthesia Checklist: Patient identified, Timeout performed, Emergency Drugs available, Suction available and Patient being monitored Patient Re-evaluated:Patient Re-evaluated prior to induction Oxygen Delivery Method: Circle system utilized Preoxygenation: Pre-oxygenation with 100% oxygen Induction Type: IV induction Ventilation: Mask ventilation without difficulty LMA: LMA inserted LMA Size: 4.0 Number of attempts: 1 Placement Confirmation: positive ETCO2 and breath sounds checked- equal and bilateral Tube secured with: Tape Dental Injury: Teeth and Oropharynx as per pre-operative assessment

## 2017-05-31 NOTE — Transfer of Care (Signed)
Immediate Anesthesia Transfer of Care Note  Patient: Jesse [name]  Procedure(s) Performed: URETEROSCOPY WITH HOLMIUM LASER LITHOTRIPSY/ RETROGRADE/ STENT PLACEMENT (Left Ureter)  Patient Location: PACU  Anesthesia Type:General  Level of Consciousness: awake, alert , oriented and patient cooperative  Airway & Oxygen Therapy: Patient Spontanous Breathing and Patient connected to nasal cannula oxygen  Post-op Assessment: Report given to RN and Post -op Vital signs reviewed and stable  Post vital signs: Reviewed and stable  Last Vitals:  Vitals:   05/31/17 0704  BP: 119/74  Pulse: 68  Resp: 16  Temp: 36.6 C  SpO2: 97%    Last Pain:  Vitals:   05/31/17 0704  TempSrc: Oral      Patients Stated Pain Goal: 3 (05/31/17 0715)  Complications: No apparent anesthesia complications

## 2017-05-31 NOTE — Discharge Instructions (Signed)

## 2017-06-01 ENCOUNTER — Encounter (HOSPITAL_BASED_OUTPATIENT_CLINIC_OR_DEPARTMENT_OTHER): Payer: Self-pay | Admitting: Urology

## 2017-11-10 ENCOUNTER — Other Ambulatory Visit: Payer: Self-pay

## 2017-11-10 ENCOUNTER — Encounter (HOSPITAL_COMMUNITY): Payer: Self-pay | Admitting: Emergency Medicine

## 2017-11-10 ENCOUNTER — Ambulatory Visit (INDEPENDENT_AMBULATORY_CARE_PROVIDER_SITE_OTHER): Payer: Managed Care, Other (non HMO)

## 2017-11-10 ENCOUNTER — Ambulatory Visit (HOSPITAL_COMMUNITY)
Admission: EM | Admit: 2017-11-10 | Discharge: 2017-11-10 | Disposition: A | Discharge status: Home / Self Care | Payer: Managed Care, Other (non HMO) | Attending: Family Medicine | Admitting: Family Medicine

## 2017-11-10 DIAGNOSIS — M62830 Muscle spasm of back: Secondary | ICD-10-CM | POA: Diagnosis not present

## 2017-11-10 MED ORDER — ACETAMINOPHEN 325 MG PO TABS: 650.0000 mg | ORAL_TABLET | ORAL | 0 refills | Status: AC | PRN

## 2017-11-10 MED ORDER — PREDNISONE 50 MG PO TABS: 50.0000 mg | ORAL_TABLET | Freq: Every day | ORAL | 0 refills | Status: AC

## 2017-11-10 MED ORDER — KETOROLAC TROMETHAMINE 60 MG/2ML IM SOLN
INTRAMUSCULAR | Status: AC
  Filled 2017-11-10: qty 2

## 2017-11-10 MED ORDER — CYCLOBENZAPRINE HCL 10 MG PO TABS: 10.0000 mg | ORAL_TABLET | Freq: Two times a day (BID) | ORAL | 0 refills | Status: DC | PRN

## 2017-11-10 MED ORDER — IBUPROFEN 800 MG PO TABS: 800.0000 mg | ORAL_TABLET | Freq: Three times a day (TID) | ORAL | 0 refills | Status: AC

## 2017-11-10 MED ORDER — KETOROLAC TROMETHAMINE 60 MG/2ML IM SOLN
60.0000 mg | Freq: Once | INTRAMUSCULAR | Status: AC
  Administered 2017-11-10: 60 mg via INTRAMUSCULAR
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
## 2017-11-10 NOTE — Discharge Instructions (Signed)
Use anti-inflammatories for pain/swelling. You may take up to 800 mg Ibuprofen every 8 hours with food. You may supplement Ibuprofen with Tylenol [phone] mg every 8 hours.   You may also use Flexeril which is a muscle relaxer.  Please use every 12 hours.  This may cause slight sedation, please use when at home for the first time to see how it will affect you.  Please also begin prednisone 50 mg for the next 5 days.  This is a steroid and helps with inflammation contributing to pain.  Please take it easy over this weekend, did not do any heavy lifting or excessive exercise over the next 2 weeks.
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
## 2018-01-02 ENCOUNTER — Other Ambulatory Visit: Payer: Self-pay | Admitting: Family Medicine

## 2018-01-02 DIAGNOSIS — R911 Solitary pulmonary nodule: Secondary | ICD-10-CM

## 2018-01-05 ENCOUNTER — Ambulatory Visit
Admission: RE | Admit: 2018-01-05 | Discharge: 2018-01-05 | Disposition: A | Discharge status: Home / Self Care | Payer: Managed Care, Other (non HMO) | Source: Ambulatory Visit | Attending: Family Medicine | Admitting: Family Medicine

## 2018-01-05 DIAGNOSIS — R911 Solitary pulmonary nodule: Secondary | ICD-10-CM

## 2018-01-05 MED ORDER — IOHEXOL 300 MG/ML  SOLN
75.0000 mL | Freq: Once | INTRAMUSCULAR | Status: AC | PRN
  Administered 2018-01-05: 75 mL via INTRAVENOUS

## 2019-03-12 ENCOUNTER — Telehealth: Payer: Self-pay | Admitting: Hematology and Oncology

## 2019-03-12 NOTE — Telephone Encounter (Signed)
Received a new patient hem referral from Dr. Drema Dallas for elevated wbc. Pt has been cld and scheduled to see Dr. Lindi Adie on 03/26/19 at 1pm. Pt aware to arrive 15 minutes early. Address and location provided to the pt.
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
## 2019-03-26 ENCOUNTER — Other Ambulatory Visit: Payer: Self-pay

## 2019-03-26 ENCOUNTER — Inpatient Hospital Stay: Payer: Managed Care, Other (non HMO) | Attending: Hematology and Oncology | Admitting: Hematology and Oncology

## 2019-03-26 DIAGNOSIS — G8929 Other chronic pain: Secondary | ICD-10-CM | POA: Diagnosis not present

## 2019-03-26 DIAGNOSIS — G4733 Obstructive sleep apnea (adult) (pediatric): Secondary | ICD-10-CM | POA: Diagnosis not present

## 2019-03-26 DIAGNOSIS — R5383 Other fatigue: Secondary | ICD-10-CM | POA: Insufficient documentation

## 2019-03-26 DIAGNOSIS — Z79899 Other long term (current) drug therapy: Secondary | ICD-10-CM | POA: Insufficient documentation

## 2019-03-26 DIAGNOSIS — M549 Dorsalgia, unspecified: Secondary | ICD-10-CM | POA: Insufficient documentation

## 2019-03-26 DIAGNOSIS — M79606 Pain in leg, unspecified: Secondary | ICD-10-CM | POA: Diagnosis not present

## 2019-03-26 DIAGNOSIS — F17210 Nicotine dependence, cigarettes, uncomplicated: Secondary | ICD-10-CM | POA: Diagnosis not present

## 2019-03-26 DIAGNOSIS — Z807 Family history of other malignant neoplasms of lymphoid, hematopoietic and related tissues: Secondary | ICD-10-CM | POA: Insufficient documentation

## 2019-03-26 DIAGNOSIS — D72829 Elevated white blood cell count, unspecified: Secondary | ICD-10-CM
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
## 2019-04-29 ENCOUNTER — Encounter (HOSPITAL_COMMUNITY): Payer: Self-pay

## 2019-04-29 ENCOUNTER — Other Ambulatory Visit: Payer: Self-pay

## 2019-04-29 ENCOUNTER — Ambulatory Visit (HOSPITAL_COMMUNITY)
Admission: EM | Admit: 2019-04-29 | Discharge: 2019-04-29 | Disposition: A | Discharge status: Home / Self Care | Payer: Managed Care, Other (non HMO) | Attending: Emergency Medicine | Admitting: Emergency Medicine

## 2019-04-29 DIAGNOSIS — M25561 Pain in right knee: Secondary | ICD-10-CM | POA: Diagnosis not present

## 2019-04-29 MED ORDER — IBUPROFEN 800 MG PO TABS: 800.0000 mg | ORAL_TABLET | Freq: Three times a day (TID) | ORAL | 0 refills | Status: DC | PRN

## 2019-04-29 MED ORDER — CYCLOBENZAPRINE HCL 10 MG PO TABS: 10.0000 mg | ORAL_TABLET | Freq: Two times a day (BID) | ORAL | 0 refills | Status: DC | PRN
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
## 2019-04-29 NOTE — ED Triage Notes (Signed)
Pt states he start having right knee pain 3 days ago.

## 2019-04-29 NOTE — Discharge Instructions (Addendum)
Take the ibuprofen and Flexeril as needed for your discomfort.  Do not drive, operate machinery, or drink alcohol with the Flexeril as it may make you drowsy.    Wear your knee brace as needed for comfort.    Call the orthopedic listed below to schedule an appointment.

## 2019-06-25 ENCOUNTER — Other Ambulatory Visit: Payer: Self-pay | Admitting: Orthopedic Surgery

## 2019-06-25 DIAGNOSIS — R609 Edema, unspecified: Secondary | ICD-10-CM

## 2019-06-25 DIAGNOSIS — R52 Pain, unspecified: Secondary | ICD-10-CM

## 2021-01-28 ENCOUNTER — Emergency Department (HOSPITAL_COMMUNITY): Payer: Managed Care, Other (non HMO) | Admitting: Anesthesiology

## 2021-01-28 ENCOUNTER — Ambulatory Visit (HOSPITAL_COMMUNITY)
Admission: EM | Admit: 2021-01-28 | Discharge: 2021-01-28 | Disposition: A | Discharge status: Home / Self Care | Payer: Managed Care, Other (non HMO) | Attending: Emergency Medicine | Admitting: Emergency Medicine

## 2021-01-28 ENCOUNTER — Encounter (HOSPITAL_COMMUNITY)
Admission: EM | Disposition: A | Discharge status: Home / Self Care | Payer: Self-pay | Source: Home / Self Care | Attending: Emergency Medicine

## 2021-01-28 ENCOUNTER — Encounter (HOSPITAL_COMMUNITY): Payer: Self-pay

## 2021-01-28 ENCOUNTER — Other Ambulatory Visit: Payer: Self-pay

## 2021-01-28 ENCOUNTER — Emergency Department (HOSPITAL_COMMUNITY): Payer: Managed Care, Other (non HMO)

## 2021-01-28 DIAGNOSIS — R109 Unspecified abdominal pain: Secondary | ICD-10-CM

## 2021-01-28 DIAGNOSIS — K810 Acute cholecystitis: Secondary | ICD-10-CM

## 2021-01-28 DIAGNOSIS — R1084 Generalized abdominal pain: Secondary | ICD-10-CM | POA: Insufficient documentation

## 2021-01-28 DIAGNOSIS — U071 COVID-19: Secondary | ICD-10-CM | POA: Insufficient documentation

## 2021-01-28 DIAGNOSIS — K8012 Calculus of gallbladder with acute and chronic cholecystitis without obstruction: Secondary | ICD-10-CM | POA: Diagnosis not present

## 2021-01-28 DIAGNOSIS — F17210 Nicotine dependence, cigarettes, uncomplicated: Secondary | ICD-10-CM | POA: Diagnosis not present

## 2021-01-28 HISTORY — PX: CHOLECYSTECTOMY: SHX55

## 2021-01-28 LAB — URINALYSIS, ROUTINE W REFLEX MICROSCOPIC
Bacteria, UA: NONE SEEN
Bilirubin Urine: NEGATIVE
Glucose, UA: NEGATIVE mg/dL
Ketones, ur: NEGATIVE mg/dL
Leukocytes,Ua: NEGATIVE
Nitrite: NEGATIVE
Protein, ur: NEGATIVE mg/dL
Specific Gravity, Urine: 1.02 (ref 1.005–1.030)
pH: 6 (ref 5.0–8.0)

## 2021-01-28 LAB — CBC
HCT: 42.6 % (ref 39.0–52.0)
Hemoglobin: 14 g/dL (ref 13.0–17.0)
MCH: 30.4 pg (ref 26.0–34.0)
MCHC: 32.9 g/dL (ref 30.0–36.0)
MCV: 92.4 fL (ref 80.0–100.0)
Platelets: 314 10*3/uL (ref 150–400)
RBC: 4.61 MIL/uL (ref 4.22–5.81)
RDW: 13.7 % (ref 11.5–15.5)
WBC: 14.9 10*3/uL — ABNORMAL HIGH (ref 4.0–10.5)
nRBC: 0 % (ref 0.0–0.2)

## 2021-01-28 LAB — COMPREHENSIVE METABOLIC PANEL
ALT: 18 U/L (ref 0–44)
AST: 20 U/L (ref 15–41)
Albumin: 3.5 g/dL (ref 3.5–5.0)
Alkaline Phosphatase: 66 U/L (ref 38–126)
Anion gap: 9 (ref 5–15)
BUN: 14 mg/dL (ref 6–20)
CO2: 28 mmol/L (ref 22–32)
Calcium: 9.2 mg/dL (ref 8.9–10.3)
Chloride: 101 mmol/L (ref 98–111)
Creatinine, Ser: 0.98 mg/dL (ref 0.61–1.24)
GFR, Estimated: >60 mL/min (ref >60)
Glucose, Bld: 131 mg/dL — ABNORMAL HIGH (ref 70–99)
Potassium: 3.9 mmol/L (ref 3.5–5.1)
Sodium: 138 mmol/L (ref 135–145)
Total Bilirubin: 0.5 mg/dL (ref 0.3–1.2)
Total Protein: 6.6 g/dL (ref 6.5–8.1)

## 2021-01-28 LAB — RESP PANEL BY RT-PCR (FLU A&B, COVID) ARPGX2
Influenza A by PCR: NEGATIVE
Influenza B by PCR: NEGATIVE
SARS Coronavirus 2 by RT PCR: POSITIVE — AB

## 2021-01-28 LAB — LIPASE, BLOOD: Lipase: 25 U/L (ref 11–51)

## 2021-01-28 SURGERY — LAPAROSCOPIC CHOLECYSTECTOMY
Anesthesia: General | Site: Abdomen

## 2021-01-28 MED ORDER — LIDOCAINE 2% (20 MG/ML) 5 ML SYRINGE
INTRAMUSCULAR | Status: DC | PRN
  Administered 2021-01-28: 60 mg via INTRAVENOUS

## 2021-01-28 MED ORDER — ROCURONIUM BROMIDE 10 MG/ML (PF) SYRINGE
PREFILLED_SYRINGE | INTRAVENOUS | Status: AC
  Filled 2021-01-28: qty 10

## 2021-01-28 MED ORDER — OXYCODONE HCL 5 MG/5ML PO SOLN: 5.0000 mg | Freq: Once | ORAL | Status: DC | PRN

## 2021-01-28 MED ORDER — DEXAMETHASONE SODIUM PHOSPHATE 10 MG/ML IJ SOLN
INTRAMUSCULAR | Status: DC | PRN
  Administered 2021-01-28: 4 mg via INTRAVENOUS

## 2021-01-28 MED ORDER — SUCCINYLCHOLINE CHLORIDE 200 MG/10ML IV SOSY
PREFILLED_SYRINGE | INTRAVENOUS | Status: AC
  Filled 2021-01-28: qty 10

## 2021-01-28 MED ORDER — FENTANYL CITRATE (PF) 250 MCG/5ML IJ SOLN
INTRAMUSCULAR | Status: DC | PRN
  Administered 2021-01-28: 150 ug via INTRAVENOUS
  Administered 2021-01-28: 100 ug via INTRAVENOUS

## 2021-01-28 MED ORDER — ACETAMINOPHEN 325 MG PO TABS
650.0000 mg | ORAL_TABLET | Freq: Four times a day (QID) | ORAL | Status: DC
  Administered 2021-01-28: 650 mg via ORAL
  Filled 2021-01-28: qty 2

## 2021-01-28 MED ORDER — ONDANSETRON HCL 4 MG/2ML IJ SOLN
4.0000 mg | Freq: Once | INTRAMUSCULAR | Status: AC
  Administered 2021-01-28: 05:00:00 4 mg via INTRAVENOUS
  Filled 2021-01-28: qty 2

## 2021-01-28 MED ORDER — BUPIVACAINE HCL (PF) 0.25 % IJ SOLN
INTRAMUSCULAR | Status: AC
  Filled 2021-01-28: qty 30

## 2021-01-28 MED ORDER — SUCCINYLCHOLINE CHLORIDE 200 MG/10ML IV SOSY
PREFILLED_SYRINGE | INTRAVENOUS | Status: DC | PRN
  Administered 2021-01-28: 160 mg via INTRAVENOUS

## 2021-01-28 MED ORDER — HYDROMORPHONE HCL 1 MG/ML IJ SOLN
INTRAMUSCULAR | Status: AC
  Filled 2021-01-28: qty 0.5

## 2021-01-28 MED ORDER — SODIUM CHLORIDE 0.9 % IR SOLN
Status: DC | PRN
  Administered 2021-01-28: 1000 mL

## 2021-01-28 MED ORDER — FENTANYL CITRATE (PF) 250 MCG/5ML IJ SOLN
INTRAMUSCULAR | Status: AC
  Filled 2021-01-28: qty 5

## 2021-01-28 MED ORDER — FENTANYL CITRATE (PF) 100 MCG/2ML IJ SOLN: 25.0000 ug | INTRAMUSCULAR | Status: DC | PRN

## 2021-01-28 MED ORDER — DOCUSATE SODIUM 100 MG PO CAPS: 100.0000 mg | ORAL_CAPSULE | Freq: Two times a day (BID) | ORAL | 0 refills | Status: AC | PRN

## 2021-01-28 MED ORDER — PHENYLEPHRINE HCL-NACL 10-0.9 MG/250ML-% IV SOLN
INTRAVENOUS | Status: DC | PRN
  Administered 2021-01-28: 50 ug/min via INTRAVENOUS

## 2021-01-28 MED ORDER — DEXAMETHASONE SODIUM PHOSPHATE 10 MG/ML IJ SOLN
INTRAMUSCULAR | Status: AC
  Filled 2021-01-28: qty 1

## 2021-01-28 MED ORDER — ROCURONIUM BROMIDE 10 MG/ML (PF) SYRINGE
PREFILLED_SYRINGE | INTRAVENOUS | Status: DC | PRN
  Administered 2021-01-28: 60 mg via INTRAVENOUS

## 2021-01-28 MED ORDER — OXYCODONE HCL 5 MG PO TABS: 5.0000 mg | ORAL_TABLET | Freq: Once | ORAL | Status: DC | PRN

## 2021-01-28 MED ORDER — ACETAMINOPHEN 10 MG/ML IV SOLN: 1000.0000 mg | Freq: Once | INTRAVENOUS | Status: DC | PRN

## 2021-01-28 MED ORDER — ONDANSETRON HCL 4 MG/2ML IJ SOLN
INTRAMUSCULAR | Status: DC | PRN
  Administered 2021-01-28: 4 mg via INTRAVENOUS

## 2021-01-28 MED ORDER — PROPOFOL 10 MG/ML IV BOLUS
INTRAVENOUS | Status: DC | PRN
  Administered 2021-01-28: 180 mg via INTRAVENOUS

## 2021-01-28 MED ORDER — PHENYLEPHRINE 40 MCG/ML (10ML) SYRINGE FOR IV PUSH (FOR BLOOD PRESSURE SUPPORT)
PREFILLED_SYRINGE | INTRAVENOUS | Status: AC
  Filled 2021-01-28: qty 10

## 2021-01-28 MED ORDER — IOHEXOL 300 MG/ML  SOLN
75.0000 mL | Freq: Once | INTRAMUSCULAR | Status: AC | PRN
  Administered 2021-01-28: 06:00:00 75 mL via INTRAVENOUS

## 2021-01-28 MED ORDER — PROPOFOL 10 MG/ML IV BOLUS
INTRAVENOUS | Status: AC
  Filled 2021-01-28: qty 20

## 2021-01-28 MED ORDER — MIDAZOLAM HCL 5 MG/5ML IJ SOLN
INTRAMUSCULAR | Status: DC | PRN
  Administered 2021-01-28: 2 mg via INTRAVENOUS

## 2021-01-28 MED ORDER — IBUPROFEN 800 MG PO TABS: 800.0000 mg | ORAL_TABLET | Freq: Three times a day (TID) | ORAL | 0 refills | Status: AC | PRN

## 2021-01-28 MED ORDER — LACTATED RINGERS IV SOLN: INTRAVENOUS | Status: DC | PRN

## 2021-01-28 MED ORDER — PHENYLEPHRINE 40 MCG/ML (10ML) SYRINGE FOR IV PUSH (FOR BLOOD PRESSURE SUPPORT)
PREFILLED_SYRINGE | INTRAVENOUS | Status: DC | PRN
  Administered 2021-01-28: 80 ug via INTRAVENOUS

## 2021-01-28 MED ORDER — MORPHINE SULFATE (PF) 2 MG/ML IV SOLN
2.0000 mg | INTRAVENOUS | Status: DC | PRN
  Administered 2021-01-28: 4 mg via INTRAVENOUS
  Filled 2021-01-28: qty 2

## 2021-01-28 MED ORDER — BUPIVACAINE HCL 0.25 % IJ SOLN
INTRAMUSCULAR | Status: DC | PRN
  Administered 2021-01-28: 6 mL

## 2021-01-28 MED ORDER — SUGAMMADEX SODIUM 200 MG/2ML IV SOLN
INTRAVENOUS | Status: DC | PRN
  Administered 2021-01-28: 200 mg via INTRAVENOUS

## 2021-01-28 MED ORDER — ACETAMINOPHEN 160 MG/5ML PO SOLN: 1000.0000 mg | Freq: Once | ORAL | Status: DC | PRN

## 2021-01-28 MED ORDER — HYDROCODONE-ACETAMINOPHEN 5-325 MG PO TABS: 1.0000 | ORAL_TABLET | Freq: Four times a day (QID) | ORAL | 0 refills | Status: AC | PRN

## 2021-01-28 MED ORDER — ONDANSETRON HCL 4 MG/2ML IJ SOLN
INTRAMUSCULAR | Status: AC
  Filled 2021-01-28: qty 2

## 2021-01-28 MED ORDER — ACETAMINOPHEN 500 MG PO TABS: 1000.0000 mg | ORAL_TABLET | Freq: Once | ORAL | Status: DC | PRN

## 2021-01-28 MED ORDER — HYDROMORPHONE HCL 1 MG/ML IJ SOLN
1.0000 mg | Freq: Once | INTRAMUSCULAR | Status: AC
  Administered 2021-01-28: 07:00:00 1 mg via INTRAVENOUS
  Filled 2021-01-28: qty 1

## 2021-01-28 MED ORDER — SODIUM CHLORIDE 0.9 % IV BOLUS (SEPSIS)
1000.0000 mL | Freq: Once | INTRAVENOUS | Status: AC
  Administered 2021-01-28: 05:00:00 1000 mL via INTRAVENOUS

## 2021-01-28 MED ORDER — HYDROMORPHONE HCL 1 MG/ML IJ SOLN
INTRAMUSCULAR | Status: DC | PRN
  Administered 2021-01-28: .5 mg via INTRAVENOUS

## 2021-01-28 MED ORDER — 0.9 % SODIUM CHLORIDE (POUR BTL) OPTIME
TOPICAL | Status: DC | PRN
  Administered 2021-01-28: 1000 mL

## 2021-01-28 MED ORDER — ACETAMINOPHEN 500 MG PO TABS: 1000.0000 mg | ORAL_TABLET | ORAL | Status: DC

## 2021-01-28 MED ORDER — MORPHINE SULFATE (PF) 4 MG/ML IV SOLN
4.0000 mg | Freq: Once | INTRAVENOUS | Status: AC
  Administered 2021-01-28: 05:00:00 4 mg via INTRAVENOUS
  Filled 2021-01-28: qty 1

## 2021-01-28 MED ORDER — MIDAZOLAM HCL 2 MG/2ML IJ SOLN
INTRAMUSCULAR | Status: AC
  Filled 2021-01-28: qty 2

## 2021-01-28 MED ORDER — SODIUM CHLORIDE 0.9 % IV SOLN
2.0000 g | Freq: Once | INTRAVENOUS | Status: AC
  Administered 2021-01-28: 07:00:00 2 g via INTRAVENOUS
  Filled 2021-01-28: qty 20

## 2021-01-28 MED ORDER — SODIUM CHLORIDE 0.9 % IV SOLN
1000.0000 mL | INTRAVENOUS | Status: DC
  Administered 2021-01-28: 05:00:00 1000 mL via INTRAVENOUS

## 2021-01-28 SURGICAL SUPPLY — 41 items
ADH SKN CLS APL DERMABOND .7 (GAUZE/BANDAGES/DRESSINGS) ×1
APL PRP STRL LF DISP 70% ISPRP (MISCELLANEOUS) ×1
BAG SPEC RTRVL 10 TROC 200 (ENDOMECHANICALS) ×1
CANISTER SUCT 3000ML PPV (MISCELLANEOUS) ×3 IMPLANT
CHLORAPREP W/TINT 26 (MISCELLANEOUS) ×3 IMPLANT
CLIP VESOLOCK MED LG 6/CT (CLIP) ×3 IMPLANT
COVER SURGICAL LIGHT HANDLE (MISCELLANEOUS) ×3 IMPLANT
COVER TRANSDUCER ULTRASND (DRAPES) ×3 IMPLANT
COVER WAND RF STERILE (DRAPES) ×3 IMPLANT
DERMABOND ADVANCED (GAUZE/BANDAGES/DRESSINGS) ×2
DERMABOND ADVANCED .7 DNX12 (GAUZE/BANDAGES/DRESSINGS) ×1 IMPLANT
ELECT REM PT RETURN 9FT ADLT (ELECTROSURGICAL) ×3
ELECTRODE REM PT RTRN 9FT ADLT (ELECTROSURGICAL) ×1 IMPLANT
GLOVE BIO SURGEON STRL SZ7.5 (GLOVE) ×3 IMPLANT
GOWN STRL REUS W/ TWL LRG LVL3 (GOWN DISPOSABLE) ×2 IMPLANT
GOWN STRL REUS W/ TWL XL LVL3 (GOWN DISPOSABLE) ×1 IMPLANT
GOWN STRL REUS W/TWL LRG LVL3 (GOWN DISPOSABLE) ×6
GOWN STRL REUS W/TWL XL LVL3 (GOWN DISPOSABLE) ×3
GRASPER SUT TROCAR 14GX15 (MISCELLANEOUS) ×3 IMPLANT
KIT BASIN OR (CUSTOM PROCEDURE TRAY) ×3 IMPLANT
KIT TURNOVER KIT B (KITS) ×3 IMPLANT
NDL INSUFFLATION 14GA 120MM (NEEDLE) ×1 IMPLANT
NEEDLE INSUFFLATION 14GA 120MM (NEEDLE) ×3 IMPLANT
NS IRRIG 1000ML POUR BTL (IV SOLUTION) ×3 IMPLANT
PAD ARMBOARD 7.5X6 YLW CONV (MISCELLANEOUS) ×3 IMPLANT
POUCH LAPAROSCOPIC INSTRUMENT (MISCELLANEOUS) ×3 IMPLANT
POUCH RETRIEVAL ECOSAC 10 (ENDOMECHANICALS) IMPLANT
POUCH RETRIEVAL ECOSAC 10MM (ENDOMECHANICALS) ×3
SCISSORS LAP 5X35 DISP (ENDOMECHANICALS) ×3 IMPLANT
SET IRRIG TUBING LAPAROSCOPIC (IRRIGATION / IRRIGATOR) ×3 IMPLANT
SET TUBE SMOKE EVAC HIGH FLOW (TUBING) ×3 IMPLANT
SLEEVE ENDOPATH XCEL 5M (ENDOMECHANICALS) ×3 IMPLANT
SPECIMEN JAR SMALL (MISCELLANEOUS) ×3 IMPLANT
SUT MNCRL AB 4-0 PS2 18 (SUTURE) ×3 IMPLANT
TOWEL GREEN STERILE (TOWEL DISPOSABLE) ×3 IMPLANT
TOWEL GREEN STERILE FF (TOWEL DISPOSABLE) ×3 IMPLANT
TRAY LAPAROSCOPIC MC (CUSTOM PROCEDURE TRAY) ×3 IMPLANT
TROCAR XCEL NON-BLD 11X100MML (ENDOMECHANICALS) ×3 IMPLANT
TROCAR XCEL NON-BLD 5MMX100MML (ENDOMECHANICALS) ×3 IMPLANT
WARMER LAPAROSCOPE (MISCELLANEOUS) ×3 IMPLANT
WATER STERILE IRR 1000ML POUR (IV SOLUTION) ×3 IMPLANT

## 2021-01-28 NOTE — Anesthesia Preprocedure Evaluation (Addendum)
Anesthesia Evaluation  Patient identified by MRN, date of birth, ID band Patient awake    Reviewed: Allergy & Precautions, NPO status , Patient's Chart, lab work & pertinent test results  History of Anesthesia Complications Negative for: history of anesthetic complications  Airway Mallampati: II  TM Distance: >3 FB Neck ROM: Full    Dental  (+) Dental Advisory Given, Teeth Intact   Pulmonary sleep apnea , Recent URI , Current Smoker,  Covid-19 Nucleic Acid Test Results Lab Results      Component                Value               Date                      SARSCOV2NAA              POSITIVE (A)        01/28/2021              breath sounds clear to auscultation       Cardiovascular negative cardio ROS   Rhythm:Regular     Neuro/Psych negative neurological ROS  negative psych ROS   GI/Hepatic Neg liver ROS, cholecystitis   Endo/Other  negative endocrine ROS  Renal/GU negative Renal ROS     Musculoskeletal negative musculoskeletal ROS (+)   Abdominal   Peds  Hematology negative hematology ROS (+)   Anesthesia Other Findings   Reproductive/Obstetrics                            Anesthesia Physical Anesthesia Plan  ASA: 2  Anesthesia Plan: General   Post-op Pain Management:    Induction: Intravenous  PONV Risk Score and Plan: 1 and Ondansetron and Dexamethasone  Airway Management Planned: Oral ETT  Additional Equipment: None  Intra-op Plan:   Post-operative Plan: Extubation in OR  Informed Consent: I have reviewed the patients History and Physical, chart, labs and discussed the procedure including the risks, benefits and alternatives for the proposed anesthesia with the patient or authorized representative who has indicated his/her understanding and acceptance.     Dental advisory given  Plan Discussed with: CRNA and Surgeon  Anesthesia Plan Comments:          Anesthesia Quick Evaluation

## 2021-01-28 NOTE — Transfer of Care (Signed)
Immediate Anesthesia Transfer of Care Note  Patient: Taher Vannote Mangiapane  Procedure(s) Performed: LAPAROSCOPIC CHOLECYSTECTOMY (Abdomen)  Patient Location: PACU  Anesthesia Type:General  Level of Consciousness: awake, alert  and oriented  Airway & Oxygen Therapy: Patient Spontanous Breathing and Patient connected to nasal cannula oxygen  Post-op Assessment: Report given to RN and Post -op Vital signs reviewed and stable  Post vital signs: Reviewed and stable  Last Vitals:  Vitals Value Taken Time  BP 124/74 01/28/21 1310  Temp 37.1 C 01/28/21 1310  Pulse 88 01/28/21 1323  Resp 15 01/28/21 1323  SpO2 92 % 01/28/21 1323  Vitals shown include unvalidated device data.  Last Pain:  Vitals:   01/28/21 1310  TempSrc:   PainSc: Asleep         Complications: No notable events documented.

## 2021-01-28 NOTE — ED Notes (Signed)
OR stated pt will state in ED until OR room is ready

## 2021-01-28 NOTE — ED Notes (Signed)
OR aware that pt covid test resulted positive

## 2021-01-28 NOTE — Anesthesia Procedure Notes (Addendum)
Procedure Name: Intubation Date/Time: 01/28/2021 11:57 AM Performed by: Renato Shin, CRNA Pre-anesthesia Checklist: Patient identified, Emergency Drugs available, Suction available and Patient being monitored Patient Re-evaluated:Patient Re-evaluated prior to induction Oxygen Delivery Method: Circle system utilized Preoxygenation: Pre-oxygenation with 100% oxygen Induction Type: IV induction, Rapid sequence and Cricoid Pressure applied Laryngoscope Size: Mac and 4 Grade View: Grade I Tube type: Oral Tube size: 7.5 mm Number of attempts: 1 Airway Equipment and Method: Stylet Placement Confirmation: ETT inserted through vocal cords under direct vision, positive ETCO2 and breath sounds checked- equal and bilateral Secured at: 22 cm Tube secured with: Tape Dental Injury: Teeth and Oropharynx as per pre-operative assessment

## 2021-01-28 NOTE — H&P (Addendum)
[facility] Admission Note  Jesse [name] October 29, 1970  [id number].    Requesting MD Cardanam MD Chief Complaint/Reason for Consult: cholecystitis   HPI:  Jesse [name] is a 50 y/o M with a history of tobacco abuse, ORIF pelvis in 2017, and RIH repair who presented to the ED with a cc upper abdominal pain. Reports pain started over 24 hours ago and was a nagging, upper abdominal pain associated with bloating. He went to work and the pain got progressively worse and became sharp, non-radiating pain in the epigastric and RUQ region. Reports similar pain 2 weeks ago that woke him up from his sleep and lasted for 3 hours. Reports mild nausea. Made himself throw up in attempt to feel better but it did not help. Denies other aggravating or alleviating factors. Denies fever, chills, SOB, urinary sxs, diarrhea, or blood in his stool. Denies changes in diet or sick contacts. At baseline he smokes 1 ppd. Denies daily alcohol use. Denies use of blood thinners. NKDA. At baseline he lives at home with his wife. He works for himself doing carpentry/HVAC work.   ROS: As above.  Review of Systems  All other systems reviewed and are negative.  Family History  Problem Relation Age of Onset   Cancer Mother    Cancer Brother     Past Medical History:  Diagnosis Date   Frequency of urination    History of pelvic fracture 06/05/2016   [facility]  s/p  left and right posterior ring , fixation with sacroiliac screw    Left ureteral stone    OSA (obstructive sleep apnea)    per pt study done 2015 tried cpap for a year but no longer uses , intolerant   Urgency of urination     Past Surgical History:  Procedure Laterality Date   INGUINAL HERNIA REPAIR Right 05-22-2009   dr Andrey Campanile [facility]   KNEE ARTHROSCOPY Right age 56   ORIF PELVIC FRACTURE N/A 06/05/2016   Procedure: OPEN REDUCTION INTERNAL FIXATION (ORIF) PELVIC FRACTURE;  Surgeon: Myrene Galas, MD;  Location: [facility] OR;  Service:  Orthopedics;  Laterality: N/A;   URETEROSCOPY WITH HOLMIUM LASER LITHOTRIPSY Left 05/31/2017   Procedure: URETEROSCOPY WITH HOLMIUM LASER LITHOTRIPSY/ RETROGRADE/ STENT PLACEMENT;  Surgeon: Rene Paci, MD;  Location: [facility];  Service: Urology;  Laterality: Left;    Social History:  reports that he has been smoking cigarettes. He has a 6.25 pack-year smoking history. His smokeless tobacco use includes snuff. He reports that he does not drink alcohol and does not use drugs.  Allergies: No Known Allergies  (Not in a hospital admission)   Blood pressure (!) 150/86, pulse 82, temperature 97.8 F (36.6 C), temperature source Oral, resp. rate 20, SpO2 91 %. Physical Exam: Constitutional: NAD; conversant; no deformities Eyes: Moist conjunctiva; no lid lag; anicteric; PERRL Neck: Trachea midline; no thyromegaly Lungs: Normal respiratory effort; no tactile fremitus CV: RRR; no palpable thrills; no pitting edema GI: Abd soft; globally tender - worse in RUQ and epigastric region with voluntary guarding. no palpable hepatosplenomegaly. Gallbladder not palpable. MSK: Normal gait; no clubbing/cyanosis Psychiatric: Appropriate affect; alert and oriented x3 Lymphatic: No palpable cervical or axillary lymphadenopathy  Results for orders placed or performed during the hospital encounter of 01/28/21 (from the past 48 hour(s))  Lipase, blood     Status: None   Collection Time: 01/28/21 12:50 AM  Result Value Ref Range   Lipase 25 11 - 51 U/L    Comment:  Performed at [facility], [address] [place], [place] [address]  Comprehensive metabolic panel     Status: Abnormal   Collection Time: 01/28/21 12:50 AM  Result Value Ref Range   Sodium 138 135 - 145 mmol/L   Potassium 3.9 3.5 - 5.1 mmol/L   Chloride 101 98 - 111 mmol/L   CO2 28 22 - 32 mmol/L   Glucose, Bld 131 (H) 70 - 99 mg/dL    Comment: Glucose reference range applies only to samples taken after  fasting for at least 8 hours.   BUN 14 6 - 20 mg/dL   Creatinine, Ser 1.91 0.61 - 1.24 mg/dL   Calcium 9.2 8.9 - 47.8 mg/dL   Total Protein 6.6 6.5 - 8.1 g/dL   Albumin 3.5 3.5 - 5.0 g/dL   AST 20 15 - 41 U/L   ALT 18 0 - 44 U/L   Alkaline Phosphatase 66 38 - 126 U/L   Total Bilirubin 0.5 0.3 - 1.2 mg/dL   GFR, Estimated >29 >56 mL/min    Comment: (NOTE) Calculated using the CKD-EPI Creatinine Equation (2021)    Anion gap 9 5 - 15    Comment: Performed at [facility], [address], [place], [place] [address]  CBC     Status: Abnormal   Collection Time: 01/28/21 12:50 AM  Result Value Ref Range   WBC 14.9 (H) 4.0 - 10.5 K/uL   RBC 4.61 4.22 - 5.81 MIL/uL   Hemoglobin 14.0 13.0 - 17.0 g/dL   HCT 65.7 84.6 - 96.2 %   MCV 92.4 80.0 - 100.0 fL   MCH 30.4 26.0 - 34.0 pg   MCHC 32.9 30.0 - 36.0 g/dL   RDW 95.2 84.1 - 32.4 %   Platelets 314 150 - 400 K/uL   nRBC 0.0 0.0 - 0.2 %    Comment: Performed at [facility], [address] [place], [place] [address]  Urinalysis, Routine w reflex microscopic Urine, Clean Catch     Status: Abnormal   Collection Time: 01/28/21  1:23 AM  Result Value Ref Range   Color, Urine YELLOW YELLOW   APPearance CLEAR CLEAR   Specific Gravity, Urine 1.020 1.005 - 1.030   pH 6.0 5.0 - 8.0   Glucose, UA NEGATIVE NEGATIVE mg/dL   Hgb urine dipstick SMALL (A) NEGATIVE   Bilirubin Urine NEGATIVE NEGATIVE   Ketones, ur NEGATIVE NEGATIVE mg/dL   Protein, ur NEGATIVE NEGATIVE mg/dL   Nitrite NEGATIVE NEGATIVE   Leukocytes,Ua NEGATIVE NEGATIVE   RBC / HPF 0-5 0 - 5 RBC/hpf   WBC, UA 0-5 0 - 5 WBC/hpf   Bacteria, UA NONE SEEN NONE SEEN   Squamous Epithelial / LPF 0-5 0 - 5   Mucus PRESENT     Comment: Performed at [facility], [address] [place], [place] [address]   CT ABDOMEN PELVIS W CONTRAST  Result Date: 01/28/2021 CLINICAL DATA:  Right lower quadrant abdominal pain. EXAM: CT ABDOMEN AND PELVIS WITH CONTRAST TECHNIQUE:  Multidetector CT imaging of the abdomen and pelvis was performed using the standard protocol following bolus administration of intravenous contrast. CONTRAST:  26mL OMNIPAQUE IOHEXOL 300 MG/ML  SOLN COMPARISON:  CT abdomen pelvis 06/05/2016, CT abdomen pelvis 03/31/2017 FINDINGS: Lower chest: Bilateral lower lobe subsegmental atelectasis. Hepatobiliary: Subcentimeter hypodensity within the right hepatic lobe is too small to characterize. Otherwise no focal lesion. Gallstones noted within the gallbladder lumen. Gallbladder wall thickening and pericholecystic fluid. No  biliary dilatation. Pancreas: No focal lesion. Normal pancreatic contour. No surrounding inflammatory changes. No main pancreatic ductal dilatation. Spleen: Normal in size without focal abnormality. Adrenals/Urinary Tract: No adrenal nodule bilaterally. No nephrolithiasis, no hydronephrosis, and no contour-deforming renal mass. No ureterolithiasis or hydroureter. The urinary bladder is unremarkable. On delayed imaging, there is no urothelial wall thickening and there are no filling defects in the opacified portions of the bilateral collecting systems or ureters. Stomach/Bowel: Stomach is within normal limits. No evidence of bowel wall thickening or dilatation. Appendix appears normal. Vascular/Lymphatic: No significant vascular findings are present. No enlarged abdominal or pelvic lymph nodes. Reproductive: Prostate is unremarkable. Other: No intraperitoneal free fluid. No intraperitoneal free gas. No organized fluid collection. Musculoskeletal: No abdominal wall hernia or abnormality. Status post sacroiliac joint fusion. No suspicious lytic or blastic osseous lesions. Similar-appearing proximal left femur bone island. No acute displaced fracture. Multilevel degenerative changes of the spine. IMPRESSION: 1. Cholelithiasis with acute cholecystitis. No findings to suggest choledocholithiasis. 2. Normal appendix. Electronically Signed   By: Tish Frederickson  M.D.   On: 01/28/2021 06:27    Assessment/Plan Tobacco abuse   Acute calculous cholecystitis  - afebrile, WBC 14.9 - history consistent with sxs cholelithiasis and now cholecystitis with severe RUQ pain, leukocytosis, and CT A/P with gallbladder wall thickening and pericholecystic fluid.  - recommend laparoscopic cholecystectomy  - the risks of surgery including bleeding, infection, damage to surrounding structures, conversion to open, need for additional procedures, and risks of general anesthesia were discussed with the patient and he would like to proceed with surgery.  - anticipate discharge home this afternoon after surgery.   Adam Phenix, PA-C [facility] Please see Amion for pager number during day hours 7:00am-4:30pm 01/28/2021, 8:17 AM

## 2021-01-28 NOTE — ED Triage Notes (Signed)
Pt reports epigastric pain for the past few days, denies n/v/d

## 2021-01-28 NOTE — Discharge Instructions (Signed)
[facility] LAPAROSCOPIC SURGERY: POST OP INSTRUCTIONS Always review your discharge instruction sheet given to you by the facility where your surgery was performed. IF YOU HAVE DISABILITY OR FAMILY LEAVE FORMS, YOU MUST BRING THEM TO THE OFFICE FOR PROCESSING.   DO NOT GIVE THEM TO YOUR DOCTOR.  PAIN CONTROL  First take acetaminophen (Tylenol) AND/or ibuprofen (Advil) to control your pain after surgery.  Follow directions on package.  Taking acetaminophen (Tylenol) and/or ibuprofen (Advil) regularly after surgery will help to control your pain and lower the amount of prescription pain medication you may need.  You should not take more than 3,000 mg (3 grams) of acetaminophen (Tylenol) in 24 hours.  You should not take ibuprofen (Advil), aleve, motrin, naprosyn or other NSAIDS if you have a history of stomach ulcers or chronic kidney disease.  A prescription for pain medication may be given to you upon discharge.  Take your pain medication as prescribed, if you still have uncontrolled pain after taking acetaminophen (Tylenol) or ibuprofen (Advil). Use ice packs to help control pain. If you need a refill on your pain medication, please contact your pharmacy.  They will contact our office to request authorization. Prescriptions will not be filled after 5pm or on week-ends.  HOME MEDICATIONS Take your usually prescribed medications unless otherwise directed.  DIET You should follow a light diet the first few days after arrival home.  Be sure to include lots of fluids daily. Avoid fatty, fried foods.   CONSTIPATION It is common to experience some constipation after surgery and if you are taking pain medication.  Increasing fluid intake and taking a stool softener (such as Colace) will usually help or prevent this problem from occurring.  A mild laxative (Milk of Magnesia or Miralax) should be taken according to package instructions if there are no bowel movements after 48  hours.  WOUND/INCISION CARE Most patients will experience some swelling and bruising in the area of the incisions.  Ice packs will help.  Swelling and bruising can take several days to resolve.  Unless discharge instructions indicate otherwise, follow guidelines below  STERI-STRIPS - you may remove your outer bandages 48 hours after surgery, and you may shower at that time.  You have steri-strips (small skin tapes) in place directly over the incision.  These strips should be left on the skin for 7-10 days.   DERMABOND/SKIN GLUE - you may shower in 24 hours.  The glue will flake off over the next 2-3 weeks. Any sutures or staples will be removed at the office during your follow-up visit.  ACTIVITIES You may resume regular (light) daily activities beginning the next day--such as daily self-care, walking, climbing stairs--gradually increasing activities as tolerated.  You may have sexual intercourse when it is comfortable.  Refrain from any heavy lifting or straining until approved by your doctor. You may drive when you are no longer taking prescription pain medication, you can comfortably wear a seatbelt, and you can safely maneuver your car and apply brakes.  FOLLOW-UP You should see your doctor in the office for a follow-up appointment approximately 2-3 weeks after your surgery.  You should have been given your post-op/follow-up appointment when your surgery was scheduled.  If you did not receive a post-op/follow-up appointment, make sure that you call for this appointment within a day or two after you arrive home to insure a convenient appointment time.   WHEN TO CALL YOUR DOCTOR: Fever over 101.0 Inability to urinate Continued bleeding from incision.   Increased pain, redness, or drainage from the incision. Increasing abdominal pain  The clinic staff is available to answer your questions during regular business hours.  Please don't hesitate to call and ask to speak to one of the nurses for  clinical concerns.  If you have a medical emergency, go to the nearest emergency room or call 911.  A surgeon from [facility] is always on call at the hospital. [address], [address], [place], [place]  [address] ? [address] [address], [place], [place]   [address] [phone] ? [phone] ? FAX [phone] Web site: [email or web address]  
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
## 2021-01-28 NOTE — ED Notes (Signed)
Patient transported to CT scan . 

## 2021-01-28 NOTE — Op Note (Signed)
01/28/2021  12:52 PM  PATIENT:  Jesse [name]  50 y.o. male  PRE-OPERATIVE DIAGNOSIS:  cholecystitis  POST-OPERATIVE DIAGNOSIS:  acute cholecystitis  PROCEDURE:  Procedure(s): LAPAROSCOPIC CHOLECYSTECTOMY (N/A)  SURGEON:  Surgeon(s) and Role:    * Axel Filler, MD - Primary  ASSISTANTS: Ruel Favors, RNFA   ANESTHESIA:   local and general  EBL:  25 mL   BLOOD ADMINISTERED:none  DRAINS: none   LOCAL MEDICATIONS USED:  BUPIVICAINE   SPECIMEN:  Gallbladder  DISPOSITION OF SPECIMEN:  PATHOLOGY  COUNTS:  YES  TOURNIQUET:  * No tourniquets in log *  DICTATION: .Dragon Dictation Findings: Very edematous gallbladder and large gallstones  The patient was taken to the operating and placed in the supine position with bilateral SCDs in place.  The patient was prepped and draped in the usual sterile fashion. A time out was called and all facts were verified. A pneumoperitoneum was obtained via A Veress needle technique to a pressure of 48mm of mercury.  A 70mm trochar was then placed in the right upper quadrant under visualization, and there were no injuries to any abdominal organs. A 11 mm port was then placed in the umbilical region after infiltrating with local anesthesia under direct visualization. A second and third epigastric port and right lower quadrant port placement under direct visualization, respectively.    The gallbladder was identified and seen to be very edematous.  It was aspirated to allow it to be grasped. It was retracted, the peritoneum was then sharply dissected from the gallbladder and this dissection was carried down to Calot's triangle. The cystic duct was identified and stripped away circumferentially and seen going into the gallbladder 360, the critical angle was obtained.  2 clips were placed proximally one distally and the cystic duct transected. The cystic artery was identified and 2 clips placed proximally and one distally and transected.  We  then proceeded to remove the gallbladder off the hepatic fossa with Bovie cautery. A retrieval bag was then placed in the abdomen and gallbladder placed in the bag. The hepatic fossa was then reexamined and hemostasis was achieved with Bovie cautery and was excellent at the end of the case.   The subhepatic fossa and perihepatic fossa was then irrigated until the effluent was clear.  The gallbladder and bag were removed from the abdominal cavity. The 11 mm trocar fascia was reapproximated with the Endo Close #1 Vicryl x3.  The pneumoperitoneum was evacuated and all trochars removed under direct visulalization.  The skin was then closed with 4-0 Monocryl and the skin dressed with Dermabond.    The patient was awaken from general anesthesia and taken to the recovery room in stable condition.   PLAN OF CARE: Discharge to home after PACU  PATIENT DISPOSITION:  PACU - hemodynamically stable.   Delay start of Pharmacological VTE agent (>24hrs) due to surgical blood loss or risk of bleeding: not applicable

## 2021-01-29 ENCOUNTER — Encounter (HOSPITAL_COMMUNITY): Payer: Self-pay | Admitting: General Surgery

## 2021-01-29 LAB — SURGICAL PATHOLOGY

## 2021-01-31 NOTE — Anesthesia Postprocedure Evaluation (Signed)
Anesthesia Post Note  Patient: Jesse [name]  Procedure(s) Performed: LAPAROSCOPIC CHOLECYSTECTOMY (Abdomen)     Patient location during evaluation: PACU Anesthesia Type: General Level of consciousness: awake and alert Pain management: pain level controlled Vital Signs Assessment: post-procedure vital signs reviewed and stable Respiratory status: spontaneous breathing, nonlabored ventilation, respiratory function stable and patient connected to nasal cannula oxygen Cardiovascular status: blood pressure returned to baseline and stable Postop Assessment: no apparent nausea or vomiting Anesthetic complications: no   No notable events documented.  Last Vitals:  Vitals:   01/28/21 1325 01/28/21 1340  BP: 127/82 (!) 143/81  Pulse: 87 92  Resp: 16 14  Temp:    SpO2: 92% 92%    Last Pain:  Vitals:   01/28/21 1340  TempSrc:   PainSc: 3                  Alonzo Korene Dula

## 2022-05-10 ENCOUNTER — Telehealth: Payer: Self-pay | Admitting: Genetic Counselor

## 2022-05-10 NOTE — Telephone Encounter (Signed)
Scheduled appt per 9/19 referral. Pt is aware of appt date and time. Pt is aware to arrive 15 mins prior to appt time and to bring and updated insurance card. Pt is aware of appt location.   

## 2022-07-07 ENCOUNTER — Other Ambulatory Visit: Payer: Self-pay

## 2022-07-07 ENCOUNTER — Inpatient Hospital Stay: Payer: BC Managed Care – PPO | Admitting: Genetic Counselor

## 2022-07-07 ENCOUNTER — Inpatient Hospital Stay: Payer: BC Managed Care – PPO

## 2022-07-07 ENCOUNTER — Encounter: Payer: Self-pay | Admitting: Genetic Counselor

## 2022-07-07 DIAGNOSIS — Z800 Family history of malignant neoplasm of digestive organs: Secondary | ICD-10-CM | POA: Diagnosis not present

## 2022-07-07 DIAGNOSIS — Z807 Family history of other malignant neoplasms of lymphoid, hematopoietic and related tissues: Secondary | ICD-10-CM

## 2022-07-07 DIAGNOSIS — Z8041 Family history of malignant neoplasm of ovary: Secondary | ICD-10-CM | POA: Diagnosis not present

## 2022-07-07 DIAGNOSIS — Z86010 Personal history of colonic polyps: Secondary | ICD-10-CM

## 2022-07-07 DIAGNOSIS — Z803 Family history of malignant neoplasm of breast: Secondary | ICD-10-CM

## 2022-07-07 LAB — GENETIC SCREENING ORDER

## 2022-07-07 NOTE — Progress Notes (Signed)
REFERRING PROVIDER: Otis Brace, MD [address] [place],  [place] [address]  PRIMARY PROVIDER:  Almira Bar, MD  PRIMARY REASON FOR VISIT:  1. History of colonic polyps   2. Family history of colon cancer    HISTORY OF PRESENT ILLNESS:   Mr. Darley, a 51 y.o. male, was seen for a [place] cancer genetics consultation at the request of Dr. Alessandra Bevels due to a personal history of colon polyps.  Mr. Mittleman presents to clinic today to discuss the possibility of a hereditary predisposition to polyposis/cancer, to discuss genetic testing, and to further clarify his future cancer risks, as well as potential cancer risks for family members.   Mr. Fester had a colonoscopy in August 2023 and more than 40 polyps were identified. At least 18 of the colon polyps were tubular adenomas.   Past Medical History:  Diagnosis Date   Frequency of urination    History of pelvic fracture 06/05/2016   [facility]  s/p  left and right posterior ring , fixation with sacroiliac screw    Left ureteral stone    OSA (obstructive sleep apnea)    per pt study done 2015 tried cpap for a year but no longer uses , intolerant   Urgency of urination     Past Surgical History:  Procedure Laterality Date   CHOLECYSTECTOMY N/A 01/28/2021   Procedure: LAPAROSCOPIC CHOLECYSTECTOMY;  Surgeon: Ralene Ok, MD;  Location: [facility];  Service: General;  Laterality: N/A;   INGUINAL HERNIA REPAIR Right 05-22-2009   dr Redmond Pulling [place] Right age 44   ORIF PELVIC FRACTURE N/A 06/05/2016   Procedure: OPEN REDUCTION INTERNAL FIXATION (ORIF) PELVIC FRACTURE;  Surgeon: Altamese Nettie, MD;  Location: [facility];  Service: Orthopedics;  Laterality: N/A;   URETEROSCOPY WITH HOLMIUM LASER LITHOTRIPSY Left 05/31/2017   Procedure: URETEROSCOPY WITH HOLMIUM LASER LITHOTRIPSY/ RETROGRADE/ STENT PLACEMENT;  Surgeon: Ceasar Mons, MD;  Location[facility];  Service: Urology;  Laterality:  Left;    Social History   Socioeconomic History   Marital status: Married    Spouse name: Not on file   Number of children: Not on file   Years of education: Not on file   Highest education level: Not on file  Occupational History   Occupation: Physiological scientist  Tobacco Use   Smoking status: Every Day    Packs/day: 0.25    Years: 25.00    Total pack years: 6.25    Types: Cigarettes   Smokeless tobacco: Current    Types: Snuff   Tobacco comments:    occasional dip tobacco  Vaping Use   Vaping Use: Never used  Substance and Sexual Activity   Alcohol use: No   Drug use: No   Sexual activity: Yes  Other Topics Concern   Not on file  Social History Narrative   ** Merged History Encounter **       Social Determinants of Health   Financial Resource Strain: Not on file  Food Insecurity: Not on file  Transportation Needs: Not on file  Physical Activity: Not on file  Stress: Not on file  Social Connections: Not on file     FAMILY HISTORY:  We obtained a detailed, 4-generation family history.  Significant diagnoses are listed below: Family History  Problem Relation Age of Onset   Breast cancer Mother        dx. 76s/50s   Ovarian cancer Mother  dx. 40s/50s   Ovarian cancer Sister    Ovarian cancer Sister    Lymphoma Brother 33 - 63   Liver cancer Maternal Uncle    Colon cancer Paternal Uncle    Colon cancer Paternal Grandfather      Mr. Stoutenburg has 4 sisters. Two sisters have a history of ovarian cancer. He has 3 brothers and one brother was diagnosed with lymphoma in his 17s, he is deceased. Mr. Alvira mother was diagnosed with breast and ovarian cancer in her late 40s/early 56s, she died in her 46s. His maternal uncle died due to metastatic liver cancer at an unknown age. Mr. Budzynski paternal uncle and paternal grandfather were diagnosed with colon cancer at unknown ages, they are deceased. Mr. Ander is unaware of previous family history of genetic  testing for hereditary cancer risks. There is no reported Ashkenazi Jewish ancestry.   GENETIC COUNSELING ASSESSMENT: Mr. Kolton is a 51 y.o. male with a personal history of colon polyps which is somewhat suggestive of a hereditary predisposition to polyposis/cancer given his history of 18+ tubular adenomas. We, therefore, discussed and recommended the following at today's visit.   DISCUSSION: We discussed that 5 - 10% of cancer/polyposis is hereditary, with most cases of hereditary polyposis associated with APC and MUTYH.  There are other genes that can be associated with hereditary polyposis/cancer syndromes.  We discussed that testing is beneficial for several reasons, including knowing about other cancer risks, identifying potential screening and risk-reduction options that may be appropriate, and to understanding if other family members could be at risk for cancer and allowing them to undergo genetic testing.  We reviewed the characteristics, features and inheritance patterns of hereditary cancer syndromes. We also discussed genetic testing, including the appropriate family members to test, the process of testing, insurance coverage and turn-around-time for results. We discussed the implications of a negative, positive, carrier and/or variant of uncertain significant result. We discussed that negative results would be uninformative given that Mr. Lenis does not have a personal history of cancer. We recommended Mr. Huskins pursue genetic testing for a panel that contains genes associated with polyposis, ovarian cancer, breast cancer, and colon cancer.  Mr. Skillman elected to have [place] Panel. The CancerNext gene panel offered by [employer] includes sequencing, rearrangement analysis, and RNA analysis for the following 36 genes:   APC, ATM, AXIN2, BARD1, BMPR1A, BRCA1, BRCA2, BRIP1, CDH1, CDK4, CDKN2A, CHEK2, DICER1, HOXB13, EPCAM, GREM1, MLH1, MSH2, MSH3, MSH6, MUTYH, NBN, NF1, NTHL1,  PALB2, PMS2, POLD1, POLE, PTEN, RAD51C, RAD51D, RECQL, SMAD4, SMARCA4, STK11, and TP53.   Based on Mr. Able personal and family history of cancer, he meets medical criteria for genetic testing. Despite that he meets criteria, he may still have an out of pocket cost. We discussed that if his out of pocket cost for testing is over $100, the laboratory will call and confirm whether he wants to proceed with testing.  If the out of pocket cost of testing is less than $100 he will be billed by the genetic testing laboratory.   PLAN: After considering the risks, benefits, and limitations, Mr. Gilliam provided informed consent to pursue genetic testing and the blood sample was sent to [facility] for analysis of the CancerNext Panel. Results should be available within approximately 2-3 weeks' time, at which point they will be disclosed by telephone to Mr. Waibel, as will any additional recommendations warranted by these results. Mr. Pietrzak will receive a summary of his genetic counseling visit and a  copy of his results once available. This information will also be available in Epic.   Mr. Rinkenberger questions were answered to his satisfaction today. Our contact information was provided should additional questions or concerns arise. Thank you for the referral and allowing [place] to share in the care of your patient.   Lucille Passy, MS, [facility] Genetic Counselor [email or web address] (P) [phone]  The patient was seen for a total of 40 minutes in face-to-face genetic counseling. The patient was seen alone.  Drs. Lindi Adie and/or Burr Medico were available to discuss this case as needed.  _______________________________________________________________________ For Office Staff:  Number of people involved in session: 1 Was an Intern/ student involved with case: no

## 2022-07-22 ENCOUNTER — Telehealth: Payer: Self-pay | Admitting: Genetic Counselor

## 2022-07-22 ENCOUNTER — Encounter: Payer: Self-pay | Admitting: Genetic Counselor

## 2022-07-22 DIAGNOSIS — Z1379 Encounter for other screening for genetic and chromosomal anomalies: Secondary | ICD-10-CM | POA: Insufficient documentation

## 2022-07-22 NOTE — Telephone Encounter (Signed)
I contacted Mr. Plack to discuss his genetic testing results. No pathogenic variants were identified in the 36 genes analyzed. Detailed clinic note to follow.  The test report has been scanned into EPIC and is located under the Molecular Pathology section of the Results Review tab.  A portion of the result report is included below for reference.   Lalla Brothers, MS, [facility] Genetic Counselor [email or web address] (P) [phone]

## 2022-07-25 ENCOUNTER — Ambulatory Visit: Payer: Self-pay | Admitting: Genetic Counselor

## 2022-07-25 DIAGNOSIS — Z1379 Encounter for other screening for genetic and chromosomal anomalies: Secondary | ICD-10-CM

## 2022-07-25 NOTE — Progress Notes (Signed)
HPI:   Jesse [name] was previously seen in the [place] clinic due to a personal history of colon polyps and concerns regarding a hereditary predisposition to cancer. Please refer to our prior cancer genetics clinic note for more information regarding our discussion, assessment and recommendations, at the time. Mr. Ellerby recent genetic test results were disclosed to him, as were recommendations warranted by these results. These results and recommendations are discussed in more detail below.  CANCER HISTORY:  Oncology History   No history exists.    FAMILY HISTORY:  We obtained a detailed, 4-generation family history.  Significant diagnoses are listed below:      Family History  Problem Relation Age of Onset   Breast cancer Mother          dx. 67s/50s   Ovarian cancer Mother          dx. 72s/50s   Ovarian cancer Sister     Ovarian cancer Sister     Lymphoma Brother 43 - 57   Liver cancer Maternal Uncle     Colon cancer Paternal Uncle     Colon cancer Paternal Grandfather         Mr. Krinke has 4 sisters. Two sisters have a history of ovarian cancer. He has 3 brothers and one brother was diagnosed with lymphoma in his 27s, he is deceased. Mr. Prom mother was diagnosed with breast and ovarian cancer in her late 40s/early 45s, she died in her 48s. His maternal uncle died due to metastatic liver cancer at an unknown age. Mr. Lindholm paternal uncle and paternal grandfather were diagnosed with colon cancer at unknown ages, they are deceased. Mr. Dunaj is unaware of previous family history of genetic testing for hereditary cancer risks. There is no reported Ashkenazi Jewish ancestry.   GENETIC TEST RESULTS:  The [facility] CancerNext Panel found no pathogenic mutations.   The CancerNext gene panel offered by [employer] includes sequencing, rearrangement analysis, and RNA analysis for the following 36 genes:   APC, ATM, AXIN2, BARD1, BMPR1A, BRCA1, BRCA2, BRIP1,  CDH1, CDK4, CDKN2A, CHEK2, DICER1, HOXB13, EPCAM, GREM1, MLH1, MSH2, MSH3, MSH6, MUTYH, NBN, NF1, NTHL1, PALB2, PMS2, POLD1, POLE, PTEN, RAD51C, RAD51D, RECQL, SMAD4, SMARCA4, STK11, and TP53.    The test report has been scanned into EPIC and is located under the Molecular Pathology section of the Results Review tab.  A portion of the result report is included below for reference. Genetic testing reported out on 07/16/2022.        Even though a pathogenic variant was not identified, possible explanations for his personal history of colon polyps and family history of cancer may include: There may be no hereditary risk for polyposis in the family. His history of colon polyps and family history of cancer may be due to other genetic or environmental factors. There may be a gene mutation in one of these genes that current testing methods cannot detect, but that chance is small. There could be another gene that has not yet been discovered, or that we have not yet tested, that is responsible for the polyposis/cancer diagnoses in the family.   Therefore, it is important to remain in touch with cancer genetics in the future so that we can continue to offer Mr. Hy the most up to date genetic testing.   ADDITIONAL GENETIC TESTING:  We discussed with Mr. Viloria that his genetic testing was fairly extensive.  If there are genes identified to increase cancer risk that can be  analyzed in the future, we would be happy to discuss and coordinate this testing at that time.    CANCER SCREENING RECOMMENDATIONS:  Mr. Matlock test result is considered negative (normal).  This means that we have not identified a hereditary cause for his personal history of colon polyps and family history of cancer at this time.   An individual's cancer risk and medical management are not determined by genetic test results alone. Overall cancer risk assessment incorporates additional factors, including personal medical history,  family history, and any available genetic information that may result in a personalized plan for cancer prevention and surveillance. Therefore, it is recommended he continue to follow the cancer management and screening guidelines provided by his healthcare providers.  RECOMMENDATIONS FOR FAMILY MEMBERS:   Since he did not inherit a mutation in a polyposis or cancer predisposition gene included on this panel, his children could not have inherited a mutation from him in one of these genes. Mr. Nannini children should begin colonoscopies at age 72.   FOLLOW-UP:  Cancer genetics is a rapidly advancing field and it is possible that new genetic tests will be appropriate for him and/or his family members in the future. We encouraged him to remain in contact with cancer genetics on an annual basis so we can update his personal and family histories and let him know of advances in cancer genetics that may benefit this family.   Our contact number was provided. Mr. Juarbe questions were answered to his satisfaction, and he knows he is welcome to call us at anytime with additional questions or concerns.   Lucille Passy, MS, [facility] Genetic Counselor [email or web address] (P) [phone]

## 2022-07-28 ENCOUNTER — Encounter: Payer: Self-pay | Admitting: Genetic Counselor

## 2023-05-23 IMAGING — CT CT ABD-PELV W/ CM
2 of 5 series · 16 of 46 positions shown, 18 images · IV contrast (omnipaque)
Comparison: CT abdomen pelvis 06/05/2016, CT abdomen pelvis
03/31/2017

CLINICAL DATA: Right lower quadrant abdominal pain.

EXAM:
CT ABDOMEN AND PELVIS WITH CONTRAST
TECHNIQUE: Multidetector CT imaging of the abdomen and pelvis was performed
using the standard protocol following bolus administration of
intravenous contrast.
CONTRAST:  75mL OMNIPAQUE IOHEXOL 300 MG/ML  SOLN

[Series 3: a/p w/ 5mm · axial · 0.88mm/px · z∈[-500,-105]mm · 13 of 89 slices shown, 15 images]
[im 5/89  soft-tissue]
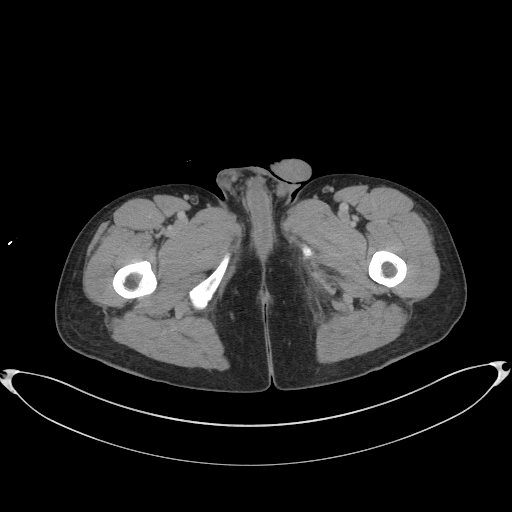
[im 5/89  bone]
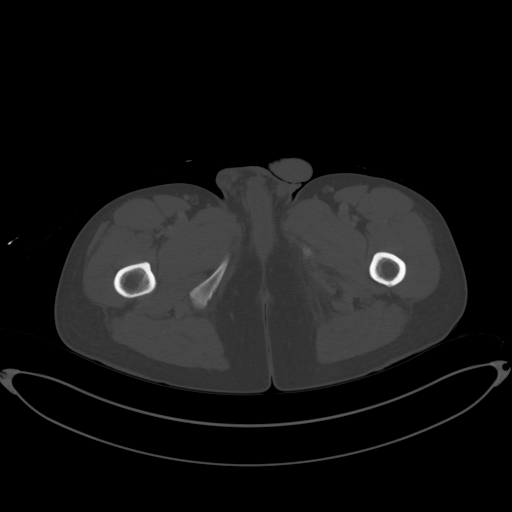
[im 14/89  soft-tissue]
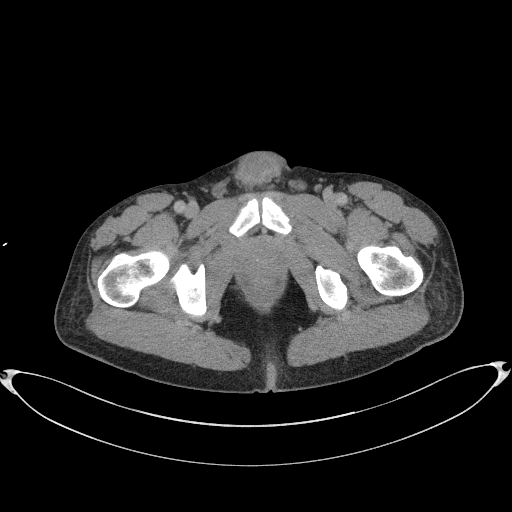
[im 18/89  soft-tissue]
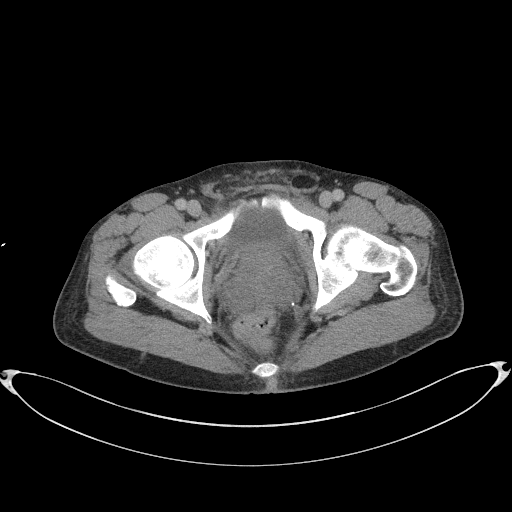
[im 27/89  soft-tissue]
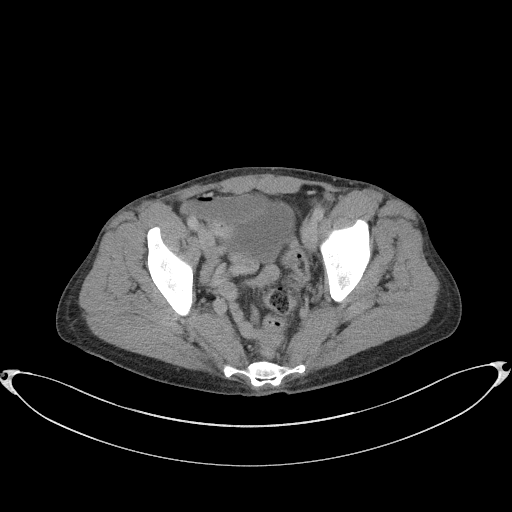
[im 31/89  soft-tissue]
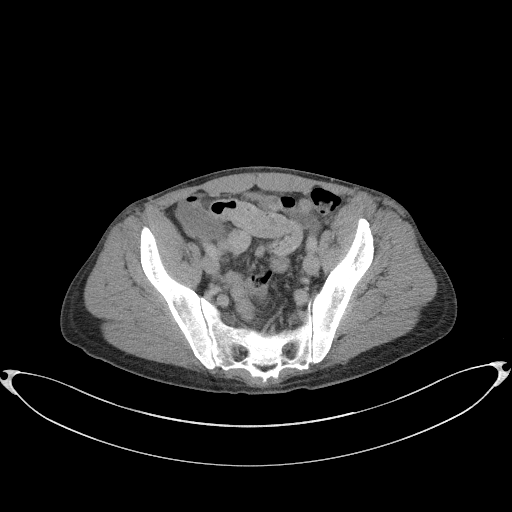
[im 40/89  soft-tissue]
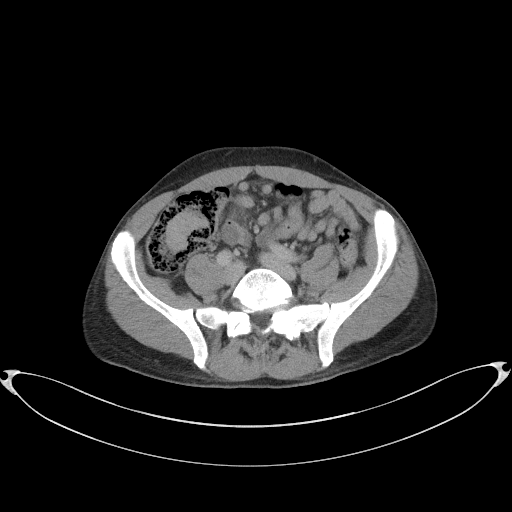
[im 45/89  soft-tissue]
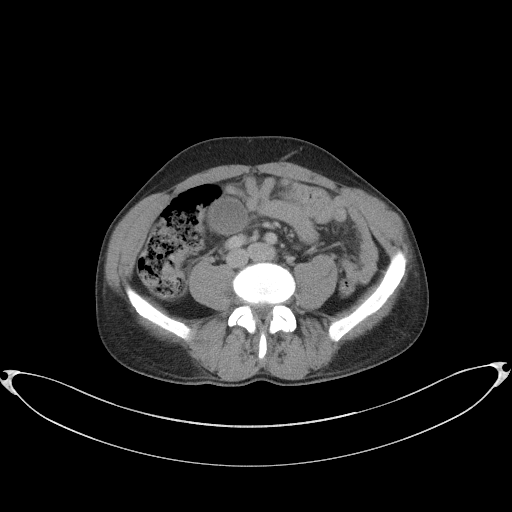
[im 49/89  soft-tissue]
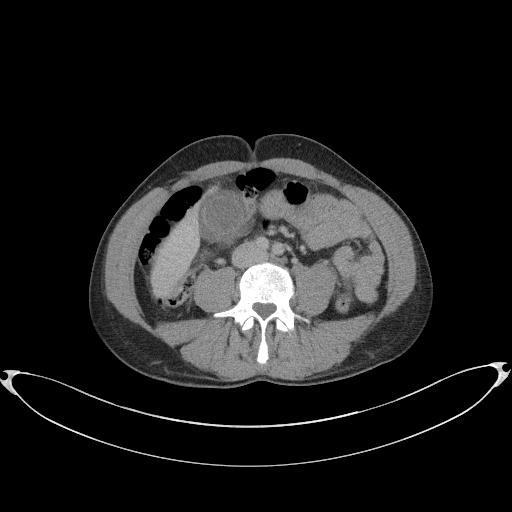
[im 58/89  soft-tissue]
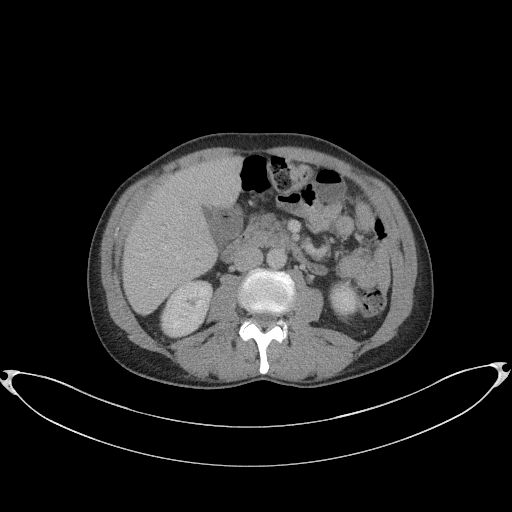
[im 58/89  bone]
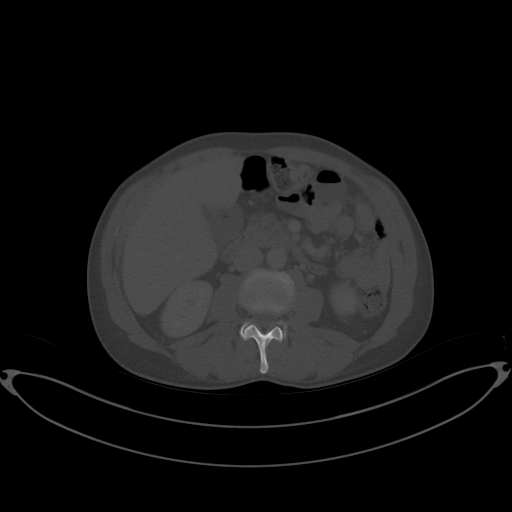
[im 62/89  soft-tissue]
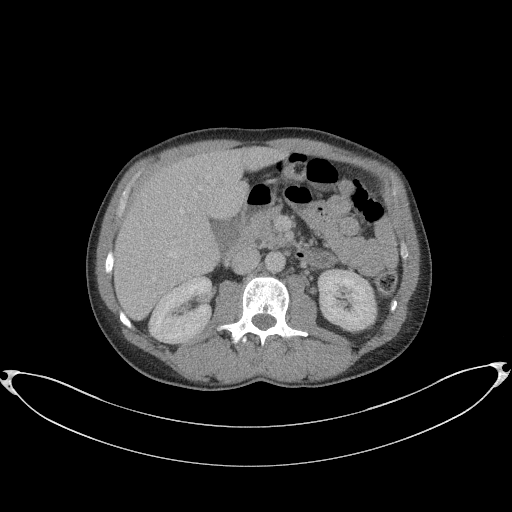
[im 71/89  soft-tissue]
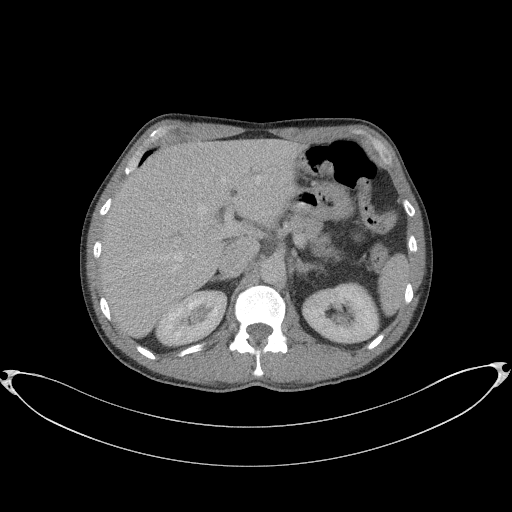
[im 75/89  soft-tissue]
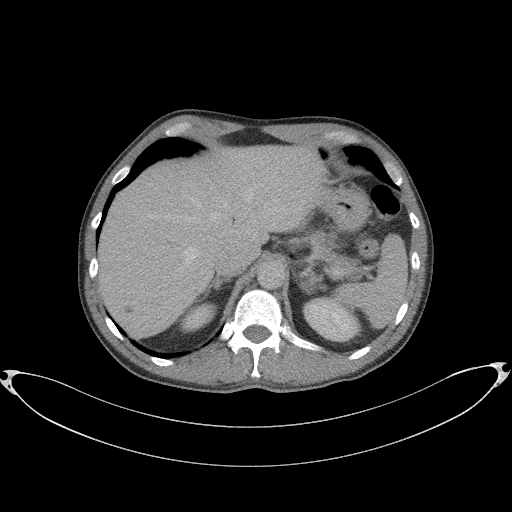
[im 84/89  soft-tissue]
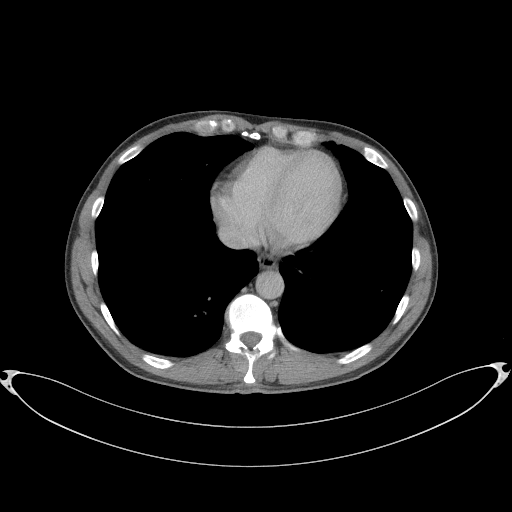

[Series 6: a/p w/ cor · coronal · 0.74mm/px · 3 of 144 slices shown]
[im 48/144  soft-tissue]
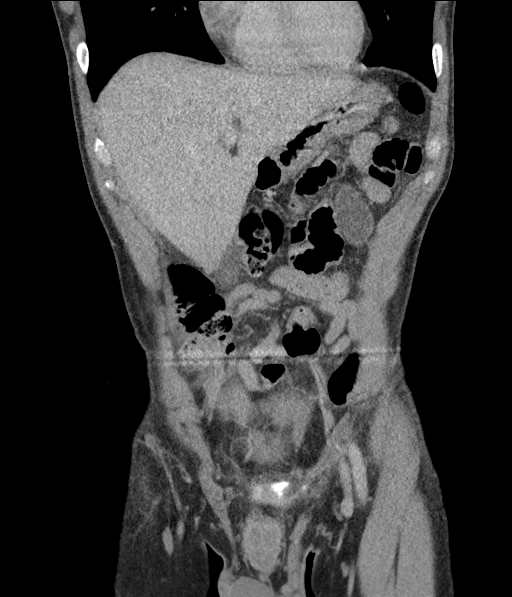
[im 64/144  soft-tissue]
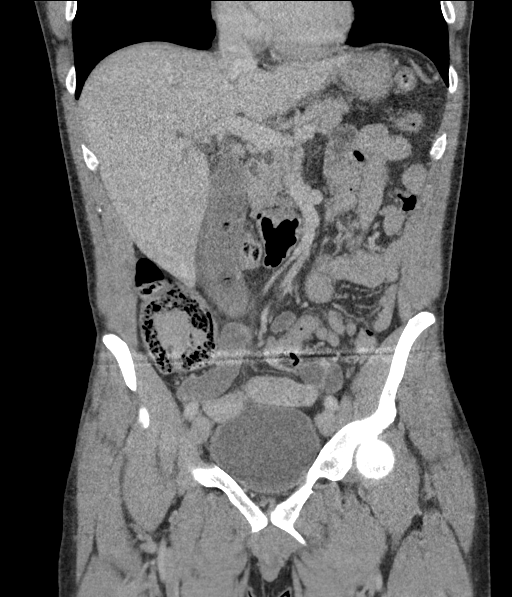
[im 80/144  soft-tissue]
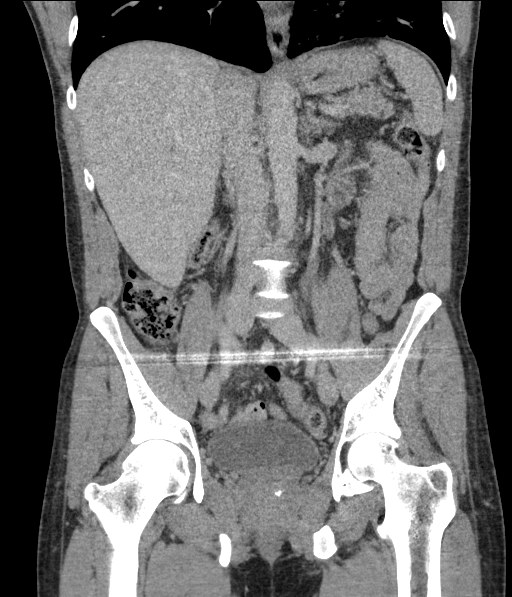

[16 of 46 positions shown; findings below may reference images not displayed]

FINDINGS: Lower chest: Bilateral lower lobe subsegmental atelectasis.

Hepatobiliary: Subcentimeter hypodensity within the right hepatic
lobe is too small to characterize. Otherwise no focal lesion.
Gallstones noted within the gallbladder lumen. Gallbladder wall
thickening and pericholecystic fluid. No biliary dilatation.

Pancreas: No focal lesion. Normal pancreatic contour. No surrounding
inflammatory changes. No main pancreatic ductal dilatation.

Spleen: Normal in size without focal abnormality.

Adrenals/Urinary Tract:

No adrenal nodule bilaterally.

No nephrolithiasis, no hydronephrosis, and no contour-deforming
renal mass. No ureterolithiasis or hydroureter.

The urinary bladder is unremarkable.

On delayed imaging, there is no urothelial wall thickening and there
are no filling defects in the opacified portions of the bilateral
collecting systems or ureters.

Stomach/Bowel: Stomach is within normal limits. No evidence of bowel
wall thickening or dilatation. Appendix appears normal.

Vascular/Lymphatic: No significant vascular findings are present. No
enlarged abdominal or pelvic lymph nodes.

Reproductive: Prostate is unremarkable.

Other: No intraperitoneal free fluid. No intraperitoneal free gas.
No organized fluid collection.

Musculoskeletal:

No abdominal wall hernia or abnormality.

Status post sacroiliac joint fusion. No suspicious lytic or blastic
osseous lesions. Similar-appearing proximal left femur bone island.
No acute displaced fracture. Multilevel degenerative changes of the
spine.
IMPRESSION: 1. Cholelithiasis with acute cholecystitis. No findings to suggest
choledocholithiasis.
2. Normal appendix.

## 2024-09-12 ENCOUNTER — Inpatient Hospital Stay (HOSPITAL_COMMUNITY)
Admission: EM | Admit: 2024-09-12 | Discharge: 2024-09-14 | DRG: 871 | Disposition: A | Attending: Internal Medicine | Admitting: Internal Medicine

## 2024-09-12 ENCOUNTER — Emergency Department (HOSPITAL_COMMUNITY)

## 2024-09-12 ENCOUNTER — Other Ambulatory Visit: Payer: Self-pay

## 2024-09-12 DIAGNOSIS — E871 Hypo-osmolality and hyponatremia: Secondary | ICD-10-CM | POA: Diagnosis present

## 2024-09-12 DIAGNOSIS — F17210 Nicotine dependence, cigarettes, uncomplicated: Secondary | ICD-10-CM | POA: Diagnosis present

## 2024-09-12 DIAGNOSIS — Z1152 Encounter for screening for COVID-19: Secondary | ICD-10-CM | POA: Diagnosis not present

## 2024-09-12 DIAGNOSIS — J310 Chronic rhinitis: Secondary | ICD-10-CM | POA: Diagnosis present

## 2024-09-12 DIAGNOSIS — A419 Sepsis, unspecified organism: Secondary | ICD-10-CM | POA: Diagnosis present

## 2024-09-12 DIAGNOSIS — Z720 Tobacco use: Secondary | ICD-10-CM | POA: Diagnosis not present

## 2024-09-12 DIAGNOSIS — J189 Pneumonia, unspecified organism: Secondary | ICD-10-CM | POA: Diagnosis present

## 2024-09-12 DIAGNOSIS — Z716 Tobacco abuse counseling: Secondary | ICD-10-CM

## 2024-09-12 DIAGNOSIS — G4733 Obstructive sleep apnea (adult) (pediatric): Secondary | ICD-10-CM | POA: Diagnosis present

## 2024-09-12 DIAGNOSIS — R6520 Severe sepsis without septic shock: Secondary | ICD-10-CM | POA: Diagnosis present

## 2024-09-12 DIAGNOSIS — J1008 Influenza due to other identified influenza virus with other specified pneumonia: Secondary | ICD-10-CM | POA: Diagnosis present

## 2024-09-12 DIAGNOSIS — J13 Pneumonia due to Streptococcus pneumoniae: Secondary | ICD-10-CM | POA: Diagnosis present

## 2024-09-12 DIAGNOSIS — J111 Influenza due to unidentified influenza virus with other respiratory manifestations: Secondary | ICD-10-CM

## 2024-09-12 DIAGNOSIS — J9601 Acute respiratory failure with hypoxia: Secondary | ICD-10-CM | POA: Diagnosis present

## 2024-09-12 DIAGNOSIS — R0602 Shortness of breath: Secondary | ICD-10-CM | POA: Diagnosis present

## 2024-09-12 LAB — CBC WITH DIFFERENTIAL/PLATELET
Basophils Absolute: 0.2 K/uL — ABNORMAL HIGH (ref 0.0–0.1)
Basophils Relative: 1 %
Eosinophils Absolute: 0 K/uL (ref 0.0–0.5)
Eosinophils Relative: 0 %
HCT: 36.8 % — ABNORMAL LOW (ref 39.0–52.0)
Hemoglobin: 12.6 g/dL — ABNORMAL LOW (ref 13.0–17.0)
Lymphocytes Relative: 7 %
Lymphs Abs: 1.3 K/uL (ref 0.7–4.0)
MCH: 30.2 pg (ref 26.0–34.0)
MCHC: 34.2 g/dL (ref 30.0–36.0)
MCV: 88.2 fL (ref 80.0–100.0)
Monocytes Absolute: 0.6 K/uL (ref 0.1–1.0)
Monocytes Relative: 3 %
Neutro Abs: 16.8 K/uL — ABNORMAL HIGH (ref 1.7–7.7)
Neutrophils Relative %: 89 %
Platelets: 199 K/uL (ref 150–400)
RBC: 4.17 MIL/uL — ABNORMAL LOW (ref 4.22–5.81)
RDW: 14.1 % (ref 11.5–15.5)
WBC: 18.9 K/uL — ABNORMAL HIGH (ref 4.0–10.5)
nRBC: 0 % (ref 0.0–0.2)

## 2024-09-12 LAB — COMPREHENSIVE METABOLIC PANEL WITH GFR
ALT: 19 U/L (ref 0–44)
AST: 30 U/L (ref 15–41)
Albumin: 2.9 g/dL — ABNORMAL LOW (ref 3.5–5.0)
Alkaline Phosphatase: 110 U/L (ref 38–126)
Anion gap: 11 (ref 5–15)
BUN: 16 mg/dL (ref 6–20)
CO2: 21 mmol/L — ABNORMAL LOW (ref 22–32)
Calcium: 7.6 mg/dL — ABNORMAL LOW (ref 8.9–10.3)
Chloride: 98 mmol/L (ref 98–111)
Creatinine, Ser: 0.84 mg/dL (ref 0.61–1.24)
GFR, Estimated: >60 mL/min
Glucose, Bld: 105 mg/dL — ABNORMAL HIGH (ref 70–99)
Potassium: 3.7 mmol/L (ref 3.5–5.1)
Sodium: 130 mmol/L — ABNORMAL LOW (ref 135–145)
Total Bilirubin: 0.6 mg/dL (ref 0.0–1.2)
Total Protein: 5.6 g/dL — ABNORMAL LOW (ref 6.5–8.1)

## 2024-09-12 LAB — URINALYSIS, W/ REFLEX TO CULTURE (INFECTION SUSPECTED)
Bacteria, UA: NONE SEEN
Bilirubin Urine: NEGATIVE
Glucose, UA: NEGATIVE mg/dL
Ketones, ur: NEGATIVE mg/dL
Leukocytes,Ua: NEGATIVE
Nitrite: NEGATIVE
Protein, ur: NEGATIVE mg/dL
Specific Gravity, Urine: 1.002 — ABNORMAL LOW (ref 1.005–1.030)
pH: 6 (ref 5.0–8.0)

## 2024-09-12 LAB — TROPONIN T, HIGH SENSITIVITY
Troponin T High Sensitivity: 7 ng/L (ref 0–19)
Troponin T High Sensitivity: <6 ng/L (ref 0–19)

## 2024-09-12 LAB — PROTIME-INR
INR: 1.2 (ref 0.8–1.2)
Prothrombin Time: 15.9 s — ABNORMAL HIGH (ref 11.4–15.2)

## 2024-09-12 LAB — RESP PANEL BY RT-PCR (RSV, FLU A&B, COVID)  RVPGX2
Influenza A by PCR: NEGATIVE
Influenza B by PCR: POSITIVE — AB
Resp Syncytial Virus by PCR: NEGATIVE
SARS Coronavirus 2 by RT PCR: NEGATIVE

## 2024-09-12 LAB — I-STAT CG4 LACTIC ACID, ED: Lactic Acid, Venous: 0.7 mmol/L (ref 0.5–1.9)

## 2024-09-12 MED ORDER — SODIUM CHLORIDE 0.9 % IV SOLN
2.0000 g | Freq: Once | INTRAVENOUS | Status: AC
  Administered 2024-09-12: 2 g via INTRAVENOUS
  Filled 2024-09-12: qty 20

## 2024-09-12 MED ORDER — KETOROLAC TROMETHAMINE 15 MG/ML IJ SOLN
15.0000 mg | Freq: Once | INTRAMUSCULAR | Status: AC
  Administered 2024-09-12: 15 mg via INTRAVENOUS
  Filled 2024-09-12: qty 1

## 2024-09-12 MED ORDER — AZITHROMYCIN 250 MG PO TABS
500.0000 mg | ORAL_TABLET | Freq: Once | ORAL | Status: AC
  Administered 2024-09-12: 500 mg via ORAL
  Filled 2024-09-12: qty 2

## 2024-09-12 NOTE — H&P (Signed)
 " History and Physical      Jesse [name] FMW:[id number] DOB: [birth year] DOA: 09/12/2024; DOS: 09/12/2024  PCP: Geofm Waldo HERO, MD  Patient coming from: home   I have personally briefly reviewed patient's old medical records in [facility]  Chief Complaint: Shortness of breath  HPI: Jesse [name] is a 54 y.o. male with medical history significant for chronic tobacco abuse, who is admitted to [facility] on 09/12/2024 with severe sepsis due to community-acquired pneumonia after presenting from home to [facility] ED complaining of shortness of breath.   3 weeks ago, the patient conveys that he developed rhinitis, rhinorrhea, cough, mild shortness of breath, subjective fever, generalized myalgias, and that the symptoms subsequently improved.  However, over the last 5 days, he has developed new shortness of breath, productive cough, subjective fever, in the absence of any associated chest pain, palpitations, diaphoresis, nausea, vomiting.  He conveys that his shortness of breath is not been associate with any orthopnea, PND, or worsening of peripheral edema.  No hemoptysis.  Denies any known baseline supplemental oxygen requirements nor any known history of chronic underlying pulmonary pathology.  Per chart review, no prior echocardiogram on file.  In the setting of above, patient contacted EMS, to brought the patient to the ED this evening for further evaluation management of the above.    ED Course:  Vital signs in the ED were notable for the following: Temperature max 100.4; initial heart rates in the low 100s, subsequent decreasing into the 70s to 80s; systolic blood pressures in the 1 teens to 130s; respiratory rate 18-36, with most recent respiratory rates in the low to mid 20s; initial oxygen saturation was 88% on room air, subsequently improving into the range of 94 to 97% on 2 L nasal cannula.  Labs were notable for the following: CMP was notable for the following:  Sodium 130 compared to most recent prior sodium value of 138 in June 2022, potassium 3.7, bicarbonate 21, anion gap 11, creatinine 0.84, calcium adjusted for mild hypoalbuminemia noted to be 8.5, avidin 2.9.  Otherwise, liver enzymes within normal limits.  High sensitive troponin T was initially 7, with repeat value trending down to less than 6.  Lactic acid 0.7.  CBC notable for evidence of count 18,900 with 89% neutrophils, hemoglobin 12.6.  Urinalysis showed no white blood cells, no bacteria and was leukocyte esterase/nitrate negative.  Blood cultures x 2 were collected prior to initiation of antibiotics.  Influenza B PCR is positive, while influenza A PCR, COVID PCR, and RSV PCR were all negative.  Per my interpretation, EKG in ED demonstrated the following: Sinus rhythm with heart 86, normal intervals, no evidence of T wave changes, nonspecific less than 1 mm ST elevation limited to V3.  Imaging in the ED, per corresponding formal radiology read, was notable for the following: 2 view chest x-ray showed left lower lobe airspace opacity consistent with pneumonia, with potential small left pleural effusion, will demonstrating no evidence of edema or pneumothorax.  While in the ED, the following were administered: Azithromycin , Rocephin , Toradol  15 mg IV x 1 dose.  Subsequently, the patient was admitted for further evaluation management of presenting severe sepsis due to community-acquired pneumonia, will will also testing positive for influenza B, complicated by acute hypoxic respiratory distress, and with presenting labs also notable for acute hyponatremia.     Review of Systems: As per HPI otherwise 10 point review of systems negative.   Past Medical History:  Diagnosis Date  Frequency of urination    History of pelvic fracture 06/05/2016   [facility]  s/p  left and right posterior ring , fixation with sacroiliac screw    Left ureteral stone    OSA (obstructive sleep apnea)    per pt study done  2015 tried cpap for a year but no longer uses , intolerant   Urgency of urination     Past Surgical History:  Procedure Laterality Date   CHOLECYSTECTOMY N/A 01/28/2021   Procedure: LAPAROSCOPIC CHOLECYSTECTOMY;  Surgeon: Rubin Calamity, MD;  Location: [facility] OR;  Service: General;  Laterality: N/A;   INGUINAL HERNIA REPAIR Right 05-22-2009   dr tanda [facility]   KNEE ARTHROSCOPY Right age 59   ORIF PELVIC FRACTURE N/A 06/05/2016   Procedure: OPEN REDUCTION INTERNAL FIXATION (ORIF) PELVIC FRACTURE;  Surgeon: Ozell Bruch, MD;  Location: [facility] OR;  Service: Orthopedics;  Laterality: N/A;   URETEROSCOPY WITH HOLMIUM LASER LITHOTRIPSY Left 05/31/2017   Procedure: URETEROSCOPY WITH HOLMIUM LASER LITHOTRIPSY/ RETROGRADE/ STENT PLACEMENT;  Surgeon: Devere Lonni Righter, MD;  Location: [facility];  Service: Urology;  Laterality: Left;    Social History:  reports that he has been smoking cigarettes. He has a 6.3 pack-year smoking history. His smokeless tobacco use includes snuff. He reports that he does not drink alcohol and does not use drugs.   Allergies[1]  Family History  Problem Relation Age of Onset   Breast cancer Mother        dx. 93s/50s   Ovarian cancer Mother        dx. 80s/50s   Ovarian cancer Sister    Ovarian cancer Sister    Lymphoma Brother 75 - 33   Liver cancer Maternal Uncle    Colon cancer Paternal Uncle    Colon cancer Paternal Grandfather     Family history reviewed and not pertinent    Prior to Admission medications  Medication Sig Start Date End Date Taking? Authorizing Provider  cyclobenzaprine  (FLEXERIL ) 10 MG tablet Take 1 tablet (10 mg total) by mouth 2 (two) times daily as needed for muscle spasms. Patient not taking: Reported on 01/28/2021 04/29/19   Corlis Burnard DEL, NP  ibuprofen  (ADVIL ) 800 MG tablet Take 1 tablet (800 mg total) by mouth every 8 (eight) hours as needed. Patient not taking: Reported on 01/28/2021 04/29/19   Corlis Burnard DEL, NP      Objective    Physical Exam: Vitals:   09/12/24 1909 09/12/24 1927 09/12/24 2215 09/12/24 2314  BP: 131/84  113/76   Pulse: (!) 108  85   Resp: 18  (!) 36   Temp: (!) 100.4 F (38 C)   98.8 F (37.1 C)  TempSrc: Oral   Oral  SpO2: 99%  94%   Weight:  75.8 kg    Height:  5' 7 (1.702 m)      General: appears to be stated age; alert, oriented Skin: warm, dry, no rash Head:  AT/[place] Mouth:  Oral mucosa membranes appear moist, normal dentition Neck: supple; trachea midline Heart:  RRR; did not appreciate any M/R/G Lungs: CTAB, did not appreciate any wheezes, rales, or rhonchi Abdomen: + BS; soft, ND, NT Extremities: no peripheral edema, no muscle wasting      Labs on Admission: I have personally reviewed following labs and imaging studies  CBC: Recent Labs  Lab 09/12/24 2001  WBC 18.9*  NEUTROABS 16.8*  HGB 12.6*  HCT 36.8*  MCV 88.2  PLT 199   Basic Metabolic Panel: Recent Labs  Lab 09/12/24 2001  NA 130*  K 3.7  CL 98  CO2 21*  GLUCOSE 105*  BUN 16  CREATININE 0.84  CALCIUM 7.6*   GFR: Estimated Creatinine Clearance: 95.1 mL/min (by C-G formula based on SCr of 0.84 mg/dL). Liver Function Tests: Recent Labs  Lab 09/12/24 2001  AST 30  ALT 19  ALKPHOS 110  BILITOT 0.6  PROT 5.6*  ALBUMIN 2.9*   No results for input(s): LIPASE, AMYLASE in the last 168 hours. No results for input(s): AMMONIA in the last 168 hours. Coagulation Profile: Recent Labs  Lab 09/12/24 2001  INR 1.2   Cardiac Enzymes: No results for input(s): CKTOTAL, CKMB, CKMBINDEX, TROPONINI in the last 168 hours. BNP (last 3 results) No results for input(s): PROBNP in the last 8760 hours. HbA1C: No results for input(s): HGBA1C in the last 72 hours. CBG: No results for input(s): GLUCAP in the last 168 hours. Lipid Profile: No results for input(s): CHOL, HDL, LDLCALC, TRIG, CHOLHDL, LDLDIRECT in the last 72 hours. Thyroid Function Tests: No  results for input(s): TSH, T4TOTAL, FREET4, T3FREE, THYROIDAB in the last 72 hours. Anemia Panel: No results for input(s): VITAMINB12, FOLATE, FERRITIN, TIBC, IRON, RETICCTPCT in the last 72 hours. Urine analysis:    Component Value Date/Time   COLORURINE STRAW (A) 09/12/2024 2127   APPEARANCEUR CLEAR 09/12/2024 2127   LABSPEC 1.002 (L) 09/12/2024 2127   PHURINE 6.0 09/12/2024 2127   GLUCOSEU NEGATIVE 09/12/2024 2127   HGBUR SMALL (A) 09/12/2024 2127   BILIRUBINUR NEGATIVE 09/12/2024 2127   KETONESUR NEGATIVE 09/12/2024 2127   PROTEINUR NEGATIVE 09/12/2024 2127   UROBILINOGEN 1.0 03/30/2017 2014   NITRITE NEGATIVE 09/12/2024 2127   LEUKOCYTESUR NEGATIVE 09/12/2024 2127    Radiological Exams on Admission: DG Chest 2 View Result Date: 09/12/2024 EXAM: 2 VIEW(S) XRAY OF THE CHEST 09/12/2024 07:50:00 PM COMPARISON: None available. CLINICAL HISTORY: Questionable sepsis. Evaluate for abnormality. FINDINGS: LUNGS AND PLEURA: Left lower lobe airspace opacity. Possible left pleural effusion. No pneumothorax. HEART AND MEDIASTINUM: No acute abnormality of the cardiac and mediastinal silhouettes. BONES AND SOFT TISSUES: No acute osseous abnormality. IMPRESSION: 1. Left lower lobe airspace disease concerning for pneumonia. 2. Possible left pleural effusion. Electronically signed by: Greig Pique MD 09/12/2024 07:57 PM EST RP Workstation: [id number]      Assessment/Plan   Principal Problem:   CAP (community acquired pneumonia) Active Problems:   SOB (shortness of breath)   Severe sepsis (HCC)   Acute hypoxic respiratory failure (HCC)   Acute hyponatremia   Tobacco abuse       #) Severe sepsis due to commune acquired pneumonia: Diagnosis on the basis of 5 days of progressive shortness of breath, productive cough, subjective fever, with mildly elevated temperature in the ED this evening, with temperature max noted to be 100.4, and with presenting labs that were  notable for leukocytosis with neutrophilic predominance, as further quantified above, while presenting chest x-ray shows left lower lobe airspace opacity consistent with pneumonia.  This is in the setting of preceding symptoms of respiratory infection, suggestive of viral illness 2-1/2 weeks ago, which subsequently resolved prior to development of his presenting 5 days of progressive shortness of breath, productive cough, subjective fever.  In the context of influenza B PCR positive this evening, this presentation, including sequence of symptoms, raises the possibility of a secondary bacterial pneumonia following initial viral respiratory infection.  Consequently, we will check MRSA PCR to evaluate for any evidence of MRSA pneumonia.   SIRS criteria met via  leukocytosis with neutrophilic predominance, tachycardia, tachypnea. Lactic acid level: Nonelevated at 0.7. Of note, given the associated presence of suspected end organ damage in the form of concominant presenting acute hypoxic respiratory distress, criteria are met for pt's sepsis to be considered severe in nature. However, in the absence of lactic acid level that is greater than or equal to 4.0, and in the absence of any associated hypotension refractory to IVF's, there are no indications for administration of a 30 mL/kg IVF bolus at this time.   Additional ED work-up/management notable for: Blood cultures x 2 were collected in the ED this evening followed by initiation of azithromycin  and Rocephin , which will be continued for empiric coverage of community-acquired pneumonia, while monitoring for result of MRSA PCR.  No e/o additional bacterial infectious process at this time, including urinalysis that is inconsistent with UTI.  Plan: CBC w/ diff and CMP in AM.  Follow for results of blood cx's x 2. Abx: Continue azithromycin , Rocephin , as above.  Add on procalcitonin level, proBNP.  Check MRSA PCR, as above.  Check strep pneumoniae urine antigen,  Legionella urine antigen in the setting of acute hyponatremia, and also check mycoplasma antibodies.  Scheduled guaifenesin , as needed Tessalon  Perles.  Incentive spirometry, flutter valve.                       #) Influenza B infection: In the setting of development of rhinitis, rhinorrhea, cough, shortness of breath, subjective fever, generalized myalgias 2 to 3 weeks ago, which subsequently resolved prior to new onset shortness of breath, cough, subjective fever with finding of left lower lobe infiltrate this evening along with influenza B PCR positive finding tonight, suspect that patient's initial symptoms that started 3 weeks ago, from the basis of a viral respiratory infection, potentially influenza B, with residual positive finding this evening superimposed on new development of bacterial pneumonia, particular given presence of left lower lobe infiltrate on presenting chest x-ray along with CBC demonstrating leukocytosis with neutrophilic predominance, associate with 89% neutrophils.  Regardless, as the patient's most recent set of symptoms started 5 days ago, he presents outside of the window for consideration of Tamiflu .  Rather, we will provide supportive measures for any residual influenza B infection, as further detailed below.  Plan: Guaifenesin , prn Tessalon  Perles, flutter valve, incentive spirometry.  As needed acetaminophen  for fever.  Repeat CBC in the morning.  Monitor on telemetry.  Refraining from Tamiflu  given timing of the patient's symptoms, as above.                         #) Acute hypoxic respiratory distress: in the context of acute respiratory symptoms and no known baseline supplemental O2 requirements, presenting O2 sat note to be 88% on room air, [place] coming into the mid to high 90s on 2 L nasal cannula, thereby meeting criteria for acute hypoxic respiratory distress as opposed to acute hypoxic respiratory failure at this time.  Appears to be on basis of community-acquired pneumonia, in the setting of 5 days of shortness of breath, cough, subjective fever, leukocytosis with neutrophilic predominance and presenting chest x-ray today shows left lower lobe airspace opacity suggestive of infiltrate and consistent with pneumonia.  No clinical or radiographic evidence at this time to suggest acute decompensated heart failure, and or any radiographic evidence to suggest pneumothorax.  There may also be a contribution from residual influenza B infection, noting that he is tested positive for influenza  B PCR this evening. no known chronic underlying pulmonary conditions .  In terms of other considered etiologies, ACS appears less likely at this time in the absence of any recent CP and in the context of presenting EKG showing no e/o acute ischemic process. Clinically, presentation is less suggestive of acute PE at this time.   Otherwise, influenza A, COVID, and RSV PCR were all negative when checked this evening.  Plan: further evaluation/management of presenting severe sepsis due to community-acquired pneumonia, including Rocephin  and azithromycin ,  as above. CMP/CBC in the AM. Check serum Mg and Phos levels.  Flutter valve, incentive spirometry.  Scheduled guaifenesin , as needed Tessalon  Perles.  Add on proBNP.  Check procalcitonin level.                     #) Acute hypo-osmolar hyponatremia: Presenting serum sodium 130 compared to most recent prior value of 138 in June 2022.  Differential appears to favor either contribution from SIADH in the setting of recent respiratory symptoms, with presenting chest x-ray showing evidence of left lower lobe infiltrate consistent with community-acquired pneumonia following preceding viral respiratory infection with finding of influenza B PCR positive result this evening vs hypovolemic contribution from ostensible losses resulting from multiple recent respiratory infections, as above  vs multifactorial contributions from both SIADH as well as ostensible losses from recent infection.   No overt pharmacologic contributions. Of note, no evidence of associated acute focal neurologic deficits and no report of recent trauma.   As the patient has remained hemodynamically stable, with baseline renal function and is tolerating p.o., will refrain from provision of additional IVF's pending the result of random urine sodium and urine osmolality studies to provide additional insight into potential contributions from dehydration vs SIADH, as subsequent management would differ depending upon these results.   Plan: monitor strict I's and O's and daily weights.  check random urine sodium, urine osmolality.  Check serum osmolality to confirm suspected hypoosmolar etiology.  Repeat CMP in the morning. Check TSH. Check serum uric acid level, as SIADH can be associated with hypouricemia due to hyperuricuria.  Further evaluation management of presenting community-acquired pneumonia/influenza B infection, as above.  Add on proBNP, procalcitonin level, as further detailed above.                        #) Chronic tobacco abuse: Patient conveys that they are a current smoker, having smoked approximately 1/4 pack/day over the last 20 to 25 years.   Plan: Counseled the patient on the importance of complete smoking discontinuation.  Order placed for prn nicotine  patch as well as prn Nicorette  gum for use during this hospitalization.            DVT prophylaxis: SCD's   Code Status: Full code Family Communication: none Disposition Plan: Per Rounding Team Consults called: none;  Admission status: inpatient (in setting of new supplemental O2 requirement)     I SPENT GREATER THAN 75  MINUTES IN CLINICAL CARE TIME/MEDICAL DECISION-MAKING IN COMPLETING THIS ADMISSION.      Eva NOVAK Derrico Zhong DO [facility]  From 7PM - 7AM   09/12/2024, 11:36 PM         [1]  No Known Allergies  "

## 2024-09-12 NOTE — ED Provider Notes (Signed)
 " [place] EMERGENCY DEPARTMENT AT [facility] Provider Note   CSN: [id number] Arrival date & time: 09/12/24  1859     Patient presents with: Shortness of Breath and Chest Pain   Jesse [name] is a 54 y.o. male.   Patient is a 54 year old male with no significant past medical history who presents with fever and cough.  He said about 3 weeks ago he had some congestion and coughing and went to an urgent care and was given a Z-Pak.  He took the Z-Pak and overall was feeling better.  About 5 days ago he started feeling sick again with fevers, coughing and myalgias.  He has had some associated shortness of breath.  He has some left side chest pain that is worse when he coughs or breathes.  He denies any vomiting.  He has had some loose stools.  He said several other family members have tested positive for the flu.  He denies any history of underlying lung disease.  He did take a puff of his daughter's inhaler earlier today for the shortness of breath.  EMS noted him to have a temperature of 104.  He was noted to be mildly hypoxic with an oxygen saturation of 90% and was placed on nasal cannula 2 L.       Prior to Admission medications  Medication Sig Start Date End Date Taking? Authorizing Provider  cyclobenzaprine  (FLEXERIL ) 10 MG tablet Take 1 tablet (10 mg total) by mouth 2 (two) times daily as needed for muscle spasms. Patient not taking: Reported on 01/28/2021 04/29/19   Corlis Burnard DEL, NP  ibuprofen  (ADVIL ) 800 MG tablet Take 1 tablet (800 mg total) by mouth every 8 (eight) hours as needed. Patient not taking: Reported on 01/28/2021 04/29/19   Corlis Burnard DEL, NP    Allergies: Patient has no known allergies.    Review of Systems  Constitutional:  Positive for fatigue and fever. Negative for chills and diaphoresis.  HENT:  Positive for congestion and rhinorrhea. Negative for sneezing.   Eyes: Negative.   Respiratory:  Positive for cough and shortness of breath.    Cardiovascular:  Positive for chest pain. Negative for leg swelling.  Gastrointestinal:  Positive for diarrhea. Negative for abdominal pain, nausea and vomiting.  Genitourinary:  Negative for difficulty urinating, flank pain and frequency.  Musculoskeletal:  Positive for myalgias. Negative for arthralgias and back pain.  Skin:  Negative for rash.  Neurological:  Negative for dizziness, speech difficulty, weakness, numbness and headaches.    Updated Vital Signs BP 113/76   Pulse 85   Temp (!) 100.4 F (38 C) (Oral)   Resp (!) 36   Ht 5' 7 (1.702 m)   Wt 75.8 kg   SpO2 94%   BMI 26.16 kg/m   Physical Exam Constitutional:      Appearance: He is well-developed.  HENT:     Head: Normocephalic and atraumatic.  Eyes:     Pupils: Pupils are equal, round, and reactive to light.  Cardiovascular:     Rate and Rhythm: Normal rate and regular rhythm.     Heart sounds: Normal heart sounds.  Pulmonary:     Effort: Pulmonary effort is normal. No respiratory distress.     Breath sounds: Normal breath sounds. No wheezing or rales.  Chest:     Chest wall: No tenderness.  Abdominal:     General: Bowel sounds are normal.     Palpations: Abdomen is soft.  Tenderness: There is no abdominal tenderness. There is no guarding or rebound.  Musculoskeletal:        General: Normal range of motion.     Cervical back: Normal range of motion and neck supple.     Comments: No edema or calf tenderness  Lymphadenopathy:     Cervical: No cervical adenopathy.  Skin:    General: Skin is warm and dry.     Findings: No rash.  Neurological:     Mental Status: He is alert and oriented to person, place, and time.     (all labs ordered are listed, but only abnormal results are displayed) Labs Reviewed  RESP PANEL BY RT-PCR (RSV, FLU A&B, COVID)  RVPGX2 - Abnormal; Notable for the following components:      Result Value   Influenza B by PCR POSITIVE (*)    All other components within normal limits   COMPREHENSIVE METABOLIC PANEL WITH GFR - Abnormal; Notable for the following components:   Sodium 130 (*)    CO2 21 (*)    Glucose, Bld 105 (*)    Calcium 7.6 (*)    Total Protein 5.6 (*)    Albumin 2.9 (*)    All other components within normal limits  CBC WITH DIFFERENTIAL/PLATELET - Abnormal; Notable for the following components:   WBC 18.9 (*)    RBC 4.17 (*)    Hemoglobin 12.6 (*)    HCT 36.8 (*)    Neutro Abs 16.8 (*)    Basophils Absolute 0.2 (*)    All other components within normal limits  PROTIME-INR - Abnormal; Notable for the following components:   Prothrombin Time 15.9 (*)    All other components within normal limits  CULTURE, BLOOD (ROUTINE X 2)  CULTURE, BLOOD (ROUTINE X 2)  URINALYSIS, W/ REFLEX TO CULTURE (INFECTION SUSPECTED)  I-STAT CG4 LACTIC ACID, ED  I-STAT CG4 LACTIC ACID, ED  TROPONIN T, HIGH SENSITIVITY  TROPONIN T, HIGH SENSITIVITY    EKG: None  Radiology: DG Chest 2 View Result Date: 09/12/2024 EXAM: 2 VIEW(S) XRAY OF THE CHEST 09/12/2024 07:50:00 PM COMPARISON: None available. CLINICAL HISTORY: Questionable sepsis. Evaluate for abnormality. FINDINGS: LUNGS AND PLEURA: Left lower lobe airspace opacity. Possible left pleural effusion. No pneumothorax. HEART AND MEDIASTINUM: No acute abnormality of the cardiac and mediastinal silhouettes. BONES AND SOFT TISSUES: No acute osseous abnormality. IMPRESSION: 1. Left lower lobe airspace disease concerning for pneumonia. 2. Possible left pleural effusion. Electronically signed by: Greig Pique MD 09/12/2024 07:57 PM EST RP Workstation: [id number]     Procedures   Medications Ordered in the ED  cefTRIAXone  (ROCEPHIN ) 2 g in sodium chloride  0.9 % 100 mL IVPB (0 g Intravenous Stopped 09/12/24 2227)  azithromycin  (ZITHROMAX ) tablet 500 mg (500 mg Oral Given 09/12/24 2148)                                    Medical Decision Making Amount and/or Complexity of Data Reviewed Labs: ordered. Radiology:  ordered.  Risk Prescription drug management.   This patient presents to the ED for concern of fever, this involves an extensive number of treatment options, and is a complaint that carries with it a high risk of complications and morbidity.  I considered the following differential and admission for this acute, potentially life threatening condition.  The differential diagnosis includes pneumonia, influenza, sepsis, COVID, viral URI, bronchitis, PE  MDM:    Patient  is 54 year old who presents with fever and coughing.  He also has myalgias.  He is noted to be tachycardic on arrival.  His oxygen saturations are little bit low when he is on a nasal cannula.  He has a little bit of tachypnea.  His CBC shows an elevated white count at 18,000.  His lactic acid is normal, troponin is normal.  Chest x-ray shows evidence of left lower lobe pneumonia.  His influenza test is positive.  He was started on IV antibiotics.  He was given IV fluids.  He is on day 5 of symptoms so out of the window for Tamiflu.  Will plan admission.  Discussed with Dr. Marcene who will admit the patient.  (Labs, imaging, consults)  Labs: I Ordered, and personally interpreted labs.  The pertinent results include: Elevated WBC count, normal lactic acid, normal troponin, positive influenza  Imaging Studies ordered: I ordered imaging studies including x-ray I independently visualized and interpreted imaging. I agree with the radiologist interpretation  Additional history obtained from EMS.  External records from outside source obtained and reviewed including prior notes  Cardiac Monitoring: The patient was maintained on a cardiac monitor.  If on the cardiac monitor, I personally viewed and interpreted the cardiac monitored which showed an underlying rhythm of: Sinus tachycardia  Reevaluation: After the interventions noted above, I reevaluated the patient and found that they have :improved  Social Determinants of Health:     Disposition: Admit to hospital  Co morbidities that complicate the patient evaluation  Past Medical History:  Diagnosis Date   Frequency of urination    History of pelvic fracture 06/05/2016   [facility]  s/p  left and right posterior ring , fixation with sacroiliac screw    Left ureteral stone    OSA (obstructive sleep apnea)    per pt study done 2015 tried cpap for a year but no longer uses , intolerant   Urgency of urination      Medicines Meds ordered this encounter  Medications   cefTRIAXone  (ROCEPHIN ) 2 g in sodium chloride  0.9 % 100 mL IVPB    Antibiotic Indication::   CAP   azithromycin  (ZITHROMAX ) tablet 500 mg    I have reviewed the patients home medicines and have made adjustments as needed  Problem List / ED Course: Problem List Items Addressed This Visit   None Visit Diagnoses       Community acquired pneumonia of left lower lobe of lung    -  Primary   Relevant Medications   cefTRIAXone  (ROCEPHIN ) 2 g in sodium chloride  0.9 % 100 mL IVPB (Completed)   azithromycin  (ZITHROMAX ) tablet 500 mg (Completed)     Influenza       Relevant Medications   cefTRIAXone  (ROCEPHIN ) 2 g in sodium chloride  0.9 % 100 mL IVPB (Completed)   azithromycin  (ZITHROMAX ) tablet 500 mg (Completed)                Final diagnoses:  Community acquired pneumonia of left lower lobe of lung  Influenza    ED Discharge Orders     None          Lenor Hollering, MD 09/12/24 2340  "

## 2024-09-12 NOTE — ED Triage Notes (Addendum)
 Pt BIB [facility] for sickness x5 days. Pt states that he has had a productive cough that's producing green sputum x5 days and that the CP started x3 days ago. State its sharp in nature and starts on the left side of his abdomen and radiates up. Pt says that he took Tamiflu this morning around 0730 and again around 1630. On EMS arrival, pt was febrile -104.1 and tachycardic in the 120s. On pt arrival to ED, pt presents visibly lethargic and SOB. Pt on 2L [place]. Tachycardic, but VSS otherwise.   EMS TX:  500 cc bolus NS  500 cc LR  650 mg Tylenol    EMS VITALS: 128/74 BP  113 HR  25 CAP  97% on 2L [place]

## 2024-09-13 ENCOUNTER — Encounter (HOSPITAL_COMMUNITY): Payer: Self-pay | Admitting: Internal Medicine

## 2024-09-13 DIAGNOSIS — Z720 Tobacco use: Secondary | ICD-10-CM | POA: Diagnosis present

## 2024-09-13 DIAGNOSIS — E871 Hypo-osmolality and hyponatremia: Secondary | ICD-10-CM | POA: Diagnosis present

## 2024-09-13 DIAGNOSIS — A419 Sepsis, unspecified organism: Secondary | ICD-10-CM | POA: Diagnosis present

## 2024-09-13 DIAGNOSIS — J9601 Acute respiratory failure with hypoxia: Secondary | ICD-10-CM | POA: Diagnosis present

## 2024-09-13 DIAGNOSIS — R0602 Shortness of breath: Secondary | ICD-10-CM | POA: Diagnosis present

## 2024-09-13 DIAGNOSIS — J111 Influenza due to unidentified influenza virus with other respiratory manifestations: Secondary | ICD-10-CM

## 2024-09-13 LAB — COMPREHENSIVE METABOLIC PANEL WITH GFR
ALT: 18 U/L (ref 0–44)
AST: 25 U/L (ref 15–41)
Albumin: 2.9 g/dL — ABNORMAL LOW (ref 3.5–5.0)
Alkaline Phosphatase: 128 U/L — ABNORMAL HIGH (ref 38–126)
Anion gap: 9 (ref 5–15)
BUN: 17 mg/dL (ref 6–20)
CO2: 22 mmol/L (ref 22–32)
Calcium: 7.9 mg/dL — ABNORMAL LOW (ref 8.9–10.3)
Chloride: 103 mmol/L (ref 98–111)
Creatinine, Ser: 0.89 mg/dL (ref 0.61–1.24)
GFR, Estimated: >60 mL/min
Glucose, Bld: 156 mg/dL — ABNORMAL HIGH (ref 70–99)
Potassium: 3.8 mmol/L (ref 3.5–5.1)
Sodium: 134 mmol/L — ABNORMAL LOW (ref 135–145)
Total Bilirubin: 0.4 mg/dL (ref 0.0–1.2)
Total Protein: 5.5 g/dL — ABNORMAL LOW (ref 6.5–8.1)

## 2024-09-13 LAB — CBC WITH DIFFERENTIAL/PLATELET
Abs Immature Granulocytes: 0.16 K/uL — ABNORMAL HIGH (ref 0.00–0.07)
Basophils Absolute: 0 K/uL (ref 0.0–0.1)
Basophils Relative: 0 %
Eosinophils Absolute: 0 K/uL (ref 0.0–0.5)
Eosinophils Relative: 0 %
HCT: 36.6 % — ABNORMAL LOW (ref 39.0–52.0)
Hemoglobin: 12.5 g/dL — ABNORMAL LOW (ref 13.0–17.0)
Immature Granulocytes: 1 %
Lymphocytes Relative: 9 %
Lymphs Abs: 1.7 K/uL (ref 0.7–4.0)
MCH: 30.5 pg (ref 26.0–34.0)
MCHC: 34.2 g/dL (ref 30.0–36.0)
MCV: 89.3 fL (ref 80.0–100.0)
Monocytes Absolute: 1.2 K/uL — ABNORMAL HIGH (ref 0.1–1.0)
Monocytes Relative: 6 %
Neutro Abs: 16.3 K/uL — ABNORMAL HIGH (ref 1.7–7.7)
Neutrophils Relative %: 84 %
Platelets: 195 K/uL (ref 150–400)
RBC: 4.1 MIL/uL — ABNORMAL LOW (ref 4.22–5.81)
RDW: 14.3 % (ref 11.5–15.5)
WBC: 19.4 K/uL — ABNORMAL HIGH (ref 4.0–10.5)
nRBC: 0 % (ref 0.0–0.2)

## 2024-09-13 LAB — PRO BRAIN NATRIURETIC PEPTIDE: Pro Brain Natriuretic Peptide: 82.9 pg/mL

## 2024-09-13 LAB — SODIUM, URINE, RANDOM: Sodium, Ur: <30 mmol/L

## 2024-09-13 LAB — OSMOLALITY, URINE: Osmolality, Ur: 726 mosm/kg (ref 300–900)

## 2024-09-13 LAB — PROCALCITONIN: Procalcitonin: 1.76 ng/mL

## 2024-09-13 LAB — STREP PNEUMONIAE URINARY ANTIGEN: Strep Pneumo Urinary Antigen: POSITIVE — AB

## 2024-09-13 LAB — MAGNESIUM: Magnesium: 2.1 mg/dL (ref 1.7–2.4)

## 2024-09-13 LAB — PHOSPHORUS: Phosphorus: 2.4 mg/dL — ABNORMAL LOW (ref 2.5–4.6)

## 2024-09-13 LAB — T4, FREE: Free T4: 1.14 ng/dL (ref 0.80–2.00)

## 2024-09-13 LAB — OSMOLALITY: Osmolality: 283 mosm/kg (ref 275–295)

## 2024-09-13 LAB — TSH: TSH: 1.53 u[IU]/mL (ref 0.350–4.500)

## 2024-09-13 LAB — CREATININE, URINE, RANDOM: Creatinine, Urine: 185 mg/dL

## 2024-09-13 MED ORDER — ACETAMINOPHEN 325 MG PO TABS
650.0000 mg | ORAL_TABLET | Freq: Four times a day (QID) | ORAL | Status: DC | PRN
  Administered 2024-09-13 (×2): 650 mg via ORAL
  Filled 2024-09-13 (×2): qty 2

## 2024-09-13 MED ORDER — ONDANSETRON HCL 4 MG/2ML IJ SOLN: 4.0000 mg | Freq: Four times a day (QID) | INTRAMUSCULAR | Status: DC | PRN

## 2024-09-13 MED ORDER — SODIUM CHLORIDE 0.9 % IV SOLN
1.0000 g | INTRAVENOUS | Status: DC
  Administered 2024-09-13: 1 g via INTRAVENOUS
  Filled 2024-09-13: qty 10

## 2024-09-13 MED ORDER — NICOTINE 14 MG/24HR TD PT24: 14.0000 mg | MEDICATED_PATCH | Freq: Every day | TRANSDERMAL | Status: DC | PRN

## 2024-09-13 MED ORDER — GUAIFENESIN ER 600 MG PO TB12
600.0000 mg | ORAL_TABLET | Freq: Two times a day (BID) | ORAL | Status: DC
  Administered 2024-09-13 – 2024-09-14 (×3): 600 mg via ORAL
  Filled 2024-09-13 (×3): qty 1

## 2024-09-13 MED ORDER — AZITHROMYCIN 500 MG PO TABS
500.0000 mg | ORAL_TABLET | Freq: Every day | ORAL | Status: DC
  Administered 2024-09-13: 500 mg via ORAL
  Filled 2024-09-13: qty 1

## 2024-09-13 MED ORDER — ACETAMINOPHEN 650 MG RE SUPP: 650.0000 mg | Freq: Four times a day (QID) | RECTAL | Status: DC | PRN

## 2024-09-13 MED ORDER — MELATONIN 3 MG PO TABS: 3.0000 mg | ORAL_TABLET | Freq: Every evening | ORAL | Status: DC | PRN

## 2024-09-13 MED ORDER — BENZONATATE 100 MG PO CAPS
200.0000 mg | ORAL_CAPSULE | Freq: Three times a day (TID) | ORAL | Status: DC | PRN
  Administered 2024-09-14 (×2): 200 mg via ORAL
  Filled 2024-09-13 (×2): qty 2

## 2024-09-13 MED ORDER — NICOTINE POLACRILEX 2 MG MT GUM: 2.0000 mg | CHEWING_GUM | OROMUCOSAL | Status: DC | PRN

## 2024-09-13 MED ORDER — OSELTAMIVIR PHOSPHATE 75 MG PO CAPS
75.0000 mg | ORAL_CAPSULE | Freq: Two times a day (BID) | ORAL | Status: DC
  Administered 2024-09-13 – 2024-09-14 (×3): 75 mg via ORAL
  Filled 2024-09-13 (×4): qty 1

## 2024-09-13 MED ORDER — OXYCODONE-ACETAMINOPHEN 5-325 MG PO TABS
1.0000 | ORAL_TABLET | ORAL | Status: DC | PRN
  Administered 2024-09-13 – 2024-09-14 (×3): 1 via ORAL
  Filled 2024-09-13 (×3): qty 1

## 2024-09-13 MED ORDER — ALBUTEROL SULFATE (2.5 MG/3ML) 0.083% IN NEBU
2.5000 mg | INHALATION_SOLUTION | RESPIRATORY_TRACT | Status: DC | PRN
  Administered 2024-09-13 – 2024-09-14 (×2): 2.5 mg via RESPIRATORY_TRACT
  Filled 2024-09-13 (×2): qty 3

## 2024-09-13 NOTE — Progress Notes (Signed)
 [facility] night cross cover note:   I was notified by the patient's RN that the patient is complaining of some pleuritic chest discomfort with cough, deep inspiration, as well as some shortness of breath.   Symptoms appear similar to that which he presented with yesterday when he was admitted for community-acquired pneumonia as well as influenza B infection.  Suspect pleuritic chest discomfort as a consequence of these respiratory infections, will also noting infiltrate on presenting chest x-ray.  For now, have added as needed Percocet to help address his pleuritic chest discomfort, with consideration for prn Toradol , and have also added prn albuterol nebulizer for him.  Has existing order for incentive spirometry.   Eva Pore, DO Hospitalist

## 2024-09-13 NOTE — Progress Notes (Signed)
 " Progress Note   Patient: Jesse [name] FMW:[id number] DOB: 12/03/1970 DOA: 09/12/2024     1 DOS: the patient was seen and examined on 09/13/2024   Brief hospital course: 54 y.o. male with medical history significant for chronic tobacco abuse, who is admitted to [facility] on 09/12/2024 with severe sepsis due to community-acquired pneumonia after presenting from home to [facility] ED complaining of shortness of breath.    3 weeks ago, the patient conveys that he developed rhinitis, rhinorrhea, cough, mild shortness of breath, subjective fever, generalized myalgias, and that the symptoms subsequently improved.  However, over the last 5 days, he has developed new shortness of breath, productive cough, subjective fever, in the absence of any associated chest pain, palpitations, diaphoresis, nausea, vomiting.  He conveys that his shortness of breath is not been associate with any orthopnea, PND, or worsening of peripheral edema.  No hemoptysis.  Denies any known baseline supplemental oxygen requirements nor any known history of chronic underlying pulmonary pathology.  Per chart review, no prior echocardiogram on file.   In the setting of above, patient contacted EMS, to brought the patient to the ED this evening for further evaluation management of the above.       ED Course:  Vital signs in the ED were notable for the following: Temperature max 100.4; initial heart rates in the low 100s, subsequent decreasing into the 70s to 80s; systolic blood pressures in the 1 teens to 130s; respiratory rate 18-36, with most recent respiratory rates in the low to mid 20s; initial oxygen saturation was 88% on room air, subsequently improving into the range of 94 to 97% on 2 L nasal cannula.   Labs were notable for the following: CMP was notable for the following: Sodium 130 compared to most recent prior sodium value of 138 in June 2022, potassium 3.7, bicarbonate 21, anion gap 11, creatinine 0.84, calcium  adjusted for mild hypoalbuminemia noted to be 8.5, avidin 2.9.  Otherwise, liver enzymes within normal limits.  High sensitive troponin T was initially 7, with repeat value trending down to less than 6.  Lactic acid 0.7.  CBC notable for evidence of count 18,900 with 89% neutrophils, hemoglobin 12.6.  Urinalysis showed no white blood cells, no bacteria and was leukocyte esterase/nitrate negative.  Blood cultures x 2 were collected prior to initiation of antibiotics.  Influenza B PCR is positive, while influenza A PCR, COVID PCR, and RSV PCR were all negative.  Assessment and Plan: Severe sepsis due to community acquired pneumonia:  - now resolved. - still feeling ill.  CXR showed LLL airspace disease +/- effusion - if little improvement, low threshold for obtaining CT chest.  - tx'ing with abx Rocephin  plus azithromycin . - Normal procalcitonin noted. -Elevated white count, however this is been elevated going back to at least 2013 so possibly could be chronic  Flu B: - hospitalized for the same, thus starting tamiflu  - more likely CAP based on CXR, though does have all classic flu symptoms.  - PCR should be negative from his illness 3 weeks ago - continue tamiflu , follow closely     #) Acute hypoxic respiratory distress: - Secondary to 1 and 2 above. - He was no longer on oxygen on my examination. - We will continue to watch.        #) Acute hypo-osmolar hyponatremia:  - Likely secondary to poor p.o. intake - Trending upwards.   - Will trend   #) Chronic tobacco abuse:  - Counseled  to quit     Subjective: Patient still feels ill.  Still with cough.  Better than prior to come to the hospital when he was at home.  Not yet hungry  Physical Exam: Vitals:   09/13/24 1145 09/13/24 1154 09/13/24 1203 09/13/24 1521  BP: 123/79  123/83 111/73  Pulse: 77  76 74  Resp: (!) 30  (!) 22 (!) 24  Temp:  98 F (36.7 C) (!) 97.5 F (36.4 C) 97.6 F (36.4 C)  TempSrc:  Oral Oral Oral   SpO2: 97%  95% 98%  Weight:      Height:       General: Patient lying in bed, ill-appearing, but not toxic/no acute distress.  Speaking in full sentences HEENT: Mucous membranes moist Heart: Regular rate and rhythm without murmur noted Lungs: Some coarse rhonchi at bases bilaterally but no focal sounds Abdomen: Soft/nondistended/nontender Extremity: No lower extremity edema Neuro: Alert and oriented x 4 without focal deficits Psych: Linear coherent thought process  Family Communication: Wife at bedside, all questions answered  Disposition: Status is: Inpatient    Time spent: 35 minutes  Author: Reyes VEAR Gaw, MD 09/13/2024 3:34 PM  For on call review [email or web address].  "

## 2024-09-14 ENCOUNTER — Other Ambulatory Visit (HOSPITAL_COMMUNITY): Payer: Self-pay

## 2024-09-14 LAB — CBC
HCT: 37.2 % — ABNORMAL LOW (ref 39.0–52.0)
Hemoglobin: 12.8 g/dL — ABNORMAL LOW (ref 13.0–17.0)
MCH: 30.5 pg (ref 26.0–34.0)
MCHC: 34.4 g/dL (ref 30.0–36.0)
MCV: 88.8 fL (ref 80.0–100.0)
Platelets: 277 10*3/uL (ref 150–400)
RBC: 4.19 MIL/uL — ABNORMAL LOW (ref 4.22–5.81)
RDW: 14.8 % (ref 11.5–15.5)
WBC: 12.4 10*3/uL — ABNORMAL HIGH (ref 4.0–10.5)
nRBC: 0 % (ref 0.0–0.2)

## 2024-09-14 LAB — BASIC METABOLIC PANEL WITH GFR
Anion gap: 10 (ref 5–15)
BUN: 18 mg/dL (ref 6–20)
CO2: 21 mmol/L — ABNORMAL LOW (ref 22–32)
Calcium: 8.1 mg/dL — ABNORMAL LOW (ref 8.9–10.3)
Chloride: 107 mmol/L (ref 98–111)
Creatinine, Ser: 0.64 mg/dL (ref 0.61–1.24)
GFR, Estimated: >60 mL/min
Glucose, Bld: 100 mg/dL — ABNORMAL HIGH (ref 70–99)
Potassium: 4.1 mmol/L (ref 3.5–5.1)
Sodium: 138 mmol/L (ref 135–145)

## 2024-09-14 LAB — MYCOPLASMA PNEUMONIAE ANTIBODY, IGM: Mycoplasma pneumo IgM: <770 U/mL (ref 0–769)

## 2024-09-14 LAB — URIC ACID: Uric Acid, Serum: 4.5 mg/dL (ref 3.7–8.6)

## 2024-09-14 MED ORDER — NAPROXEN 250 MG PO TABS
250.0000 mg | ORAL_TABLET | Freq: Three times a day (TID) | ORAL | Status: DC
  Administered 2024-09-14: 250 mg via ORAL
  Filled 2024-09-14 (×2): qty 1

## 2024-09-14 MED ORDER — PREDNISONE 10 MG PO TABS
ORAL_TABLET | ORAL | 0 refills | Status: AC
  Filled 2024-09-14: qty 6, 4d supply, fill #0

## 2024-09-14 MED ORDER — NICOTINE POLACRILEX 2 MG MT GUM: 2.0000 mg | CHEWING_GUM | OROMUCOSAL | 0 refills | Status: AC | PRN

## 2024-09-14 MED ORDER — LIDOCAINE 5 % EX PTCH
1.0000 | MEDICATED_PATCH | CUTANEOUS | Status: DC
  Administered 2024-09-14: 1 via TRANSDERMAL
  Filled 2024-09-14: qty 1

## 2024-09-14 MED ORDER — BENZONATATE 200 MG PO CAPS
200.0000 mg | ORAL_CAPSULE | Freq: Three times a day (TID) | ORAL | 0 refills | Status: AC | PRN
  Filled 2024-09-14: qty 20, 7d supply, fill #0

## 2024-09-14 MED ORDER — CEFADROXIL 500 MG PO CAPS
500.0000 mg | ORAL_CAPSULE | Freq: Two times a day (BID) | ORAL | 0 refills | Status: AC
  Filled 2024-09-14: qty 10, 5d supply, fill #0

## 2024-09-14 MED ORDER — GUAIFENESIN ER 600 MG PO TB12: 600.0000 mg | ORAL_TABLET | Freq: Two times a day (BID) | ORAL | 0 refills | Status: AC

## 2024-09-14 MED ORDER — NICOTINE 14 MG/24HR TD PT24: 14.0000 mg | MEDICATED_PATCH | Freq: Every day | TRANSDERMAL | 0 refills | Status: AC | PRN

## 2024-09-14 MED ORDER — TRAMADOL HCL 50 MG PO TABS
50.0000 mg | ORAL_TABLET | Freq: Three times a day (TID) | ORAL | 0 refills | Status: AC | PRN
  Filled 2024-09-14: qty 15, 5d supply, fill #0

## 2024-09-14 MED ORDER — PREDNISONE 20 MG PO TABS: 20.0000 mg | ORAL_TABLET | Freq: Every day | ORAL | Status: DC

## 2024-09-14 MED ORDER — CEFADROXIL 500 MG PO CAPS
500.0000 mg | ORAL_CAPSULE | Freq: Two times a day (BID) | ORAL | Status: DC
  Administered 2024-09-14: 500 mg via ORAL
  Filled 2024-09-14 (×2): qty 1

## 2024-09-14 MED ORDER — ENSURE PLUS HIGH PROTEIN PO LIQD
237.0000 mL | Freq: Three times a day (TID) | ORAL | Status: DC
  Administered 2024-09-14: 237 mL via ORAL

## 2024-09-14 MED ORDER — OSELTAMIVIR PHOSPHATE 75 MG PO CAPS
75.0000 mg | ORAL_CAPSULE | Freq: Two times a day (BID) | ORAL | 0 refills | Status: AC
  Filled 2024-09-14: qty 8, 4d supply, fill #0

## 2024-09-14 MED ORDER — NAPROXEN 250 MG PO TABS
250.0000 mg | ORAL_TABLET | Freq: Three times a day (TID) | ORAL | 0 refills | Status: AC | PRN
  Filled 2024-09-14: qty 9, 3d supply, fill #0

## 2024-09-14 MED ORDER — METHYLPREDNISOLONE SODIUM SUCC 40 MG IJ SOLR
40.0000 mg | Freq: Once | INTRAMUSCULAR | Status: AC
  Administered 2024-09-14: 40 mg via INTRAVENOUS
  Filled 2024-09-14: qty 1

## 2024-09-14 MED ORDER — NAPROXEN 250 MG PO TABS
250.0000 mg | ORAL_TABLET | Freq: Three times a day (TID) | ORAL | 0 refills | Status: DC
  Filled 2024-09-14: qty 9, 3d supply, fill #0

## 2024-09-14 MED ORDER — LIDOCAINE 5 % EX PTCH: 1.0000 | MEDICATED_PATCH | CUTANEOUS | 0 refills | Status: AC

## 2024-09-14 NOTE — Progress Notes (Signed)
 Pt ambulated 170 feet in hallway without any issues. 93% on RA sitting, and 96% on RA with ambulation. Made MD aware of results.

## 2024-09-14 NOTE — Progress Notes (Signed)
 DISCHARGE NOTE HOME [place] [place] to be discharged Home per MD order. Discussed prescriptions and follow up appointments with the patient. Prescriptions given to patient; medication list explained in detail. Patient verbalized understanding.  Skin clean, dry and intact without evidence of skin break down, no evidence of skin tears noted. IV catheter discontinued intact. Site without signs and symptoms of complications. Dressing and pressure applied. Pt denies pain at the site currently. No complaints noted.  Patient free of lines, drains, and wounds.   An After Visit Summary (AVS) was printed and given to the patient. Patient escorted via wheelchair, and discharged home via private auto.  Peyton SHAUNNA Pepper, RN

## 2024-09-14 NOTE — Discharge Summary (Signed)
 " Physician Discharge Summary   Patient: Jesse [name] MRN: [id number] DOB: 10-18-1970  Admit date:     09/12/2024  Discharge date: 09/14/2024  Discharge Physician: Yetta Blanch  PCP: Geofm Waldo HERO, MD  Disposition: Home Recommendations at discharge: Follow-up with PCP. Repeat chest x-ray in 4 weeks.   Follow-up Information     Geofm Waldo HERO, MD. Schedule an appointment as soon as possible for a visit in 1 week(s).   Specialty: Family Medicine Why: with Repeat Chest X ray in 4 weeks Contact information: [address] [place] [address] [phone]                Hospital Course: Patient with PMH of active smoking presented to the hospital with complaints of cough and shortness of breath. Currently being treated for community-acquired pneumonia with streptococcal pneumoniae as well as influenza B infection.  Sepsis due to community-acquired pneumonia. Does not have any evidence of severe sepsis. Met SIRS criteria with leukocytosis tachycardia and tachypnea. Urine antigen came back positive for streptococcal pneumoniae. Blood cultures have been negative. Influenza B was also positive as well. MRSA PCR negative. Treated with IV antibiotic. Will switch to oral antibiotic. Provide a short course of steroid. Provide supportive care with pain medications as well. Patient ambulated on the hallway with normal oxygen on room air.  Active smoker. Patient counseled to quit smoking. Monitor.  Mild hyponatremia. Likely from poor p.o. intake. Monitor outpatient.  Pain control - [place]  Controlled Substance Reporting System database was reviewed. and patient was instructed, not to drive, operate heavy machinery, perform activities at heights, swimming or participation in water activities or provide baby-sitting services while on Pain, Sleep and Anxiety Medications; until their outpatient Physician has advised to do so again. Also  recommended to not to take more than prescribed Pain, Sleep and Anxiety Medications.   Family Communication: Plan was discussed with FamilyThe pt provided permission to discuss medical plan with the family. Opportunity was given to ask question and all questions were answered satisfactorily.  Consultants:  None  Procedures performed:  None  DISCHARGE MEDICATION: Allergies as of 09/14/2024   No Known Allergies      Medication List     TAKE these medications    acetaminophen  500 MG tablet Commonly known as: TYLENOL  Take 1,000 mg by mouth every 6 (six) hours as needed for moderate pain (pain score 4-6), mild pain (pain score 1-3), headache or fever.   Aspirin-Acetaminophen -Caffeine 500-325-65 MG Pack Take 1 packet by mouth every 6 (six) hours as needed (headaches).   benzonatate  200 MG capsule Commonly known as: TESSALON  Take 1 capsule (200 mg total) by mouth 3 (three) times daily as needed for cough.   cefadroxil  500 MG capsule Commonly known as: DURICEF Take 1 capsule (500 mg total) by mouth 2 (two) times daily for 5 days.   guaiFENesin  600 MG 12 hr tablet Commonly known as: MUCINEX  Take 1 tablet (600 mg total) by mouth 2 (two) times daily for 7 days.   lidocaine  5 % Commonly known as: LIDODERM  Place 1 patch onto the skin daily. Remove & Discard patch within 12 hours or as directed by MD Start taking on: September 15, 2024   naproxen  250 MG tablet Commonly known as: NAPROSYN  Take 1 tablet (250 mg total) by mouth 3 (three) times daily with meals as needed for up to 3 days for moderate pain (pain score 4-6) or headache.   nicotine  14 mg/24hr patch Commonly known as:  NICODERM CQ  - dosed in mg/24 hours Place 1 patch (14 mg total) onto the skin daily as needed (smoking cessation).   nicotine  polacrilex 2 MG gum Commonly known as: NICORETTE  Take 1 each (2 mg total) by mouth as needed for smoking cessation.   oseltamivir  75 MG capsule Commonly known as: TAMIFLU  Take 1  capsule (75 mg total) by mouth 2 (two) times daily for 4 days.   predniSONE  10 MG tablet Commonly known as: DELTASONE  Take 2 tablets (20 mg total) by mouth daily for 2 days, THEN 1 tablet (10 mg total) daily for 2 days. Start taking on: September 15, 2024   traMADol  50 MG tablet Commonly known as: Ultram  Take 1 tablet (50 mg total) by mouth every 8 (eight) hours as needed for severe pain (pain score 7-10).        Diet recommendation: Regular diet  Discharge Exam: Vitals:   09/14/24 0642 09/14/24 0800 09/14/24 0827 09/14/24 1236  BP:  121/83 130/84 (!) 141/89  Pulse:  67 73 75  Resp:  (!) 23 (!) 24 20  Temp:   97.7 F (36.5 C) 97.7 F (36.5 C)  TempSrc:  Oral Oral Oral  SpO2: 92% 91% 94%   Weight:      Height:        Filed Weights   09/12/24 1927  Weight: 75.8 kg   Basal crackles heard S1-S2 present. Bowel sound.  Present.  Condition at discharge: stable  The results of significant diagnostics from this hospitalization (including imaging, microbiology, ancillary and laboratory) are listed below for reference.   Imaging Studies: DG Chest 2 View Result Date: 09/12/2024 EXAM: 2 VIEW(S) XRAY OF THE CHEST 09/12/2024 07:50:00 PM COMPARISON: None available. CLINICAL HISTORY: Questionable sepsis. Evaluate for abnormality. FINDINGS: LUNGS AND PLEURA: Left lower lobe airspace opacity. Possible left pleural effusion. No pneumothorax. HEART AND MEDIASTINUM: No acute abnormality of the cardiac and mediastinal silhouettes. BONES AND SOFT TISSUES: No acute osseous abnormality. IMPRESSION: 1. Left lower lobe airspace disease concerning for pneumonia. 2. Possible left pleural effusion. Electronically signed by: Greig Pique MD 09/12/2024 07:57 PM EST RP Workstation: [id number]    Microbiology: Results for orders placed or performed during the hospital encounter of 09/12/24  Resp panel by RT-PCR (RSV, Flu A&B, Covid) Anterior Nasal Swab     Status: Abnormal   Collection Time: 09/12/24   7:21 PM   Specimen: Anterior Nasal Swab  Result Value Ref Range Status   SARS Coronavirus 2 by RT PCR NEGATIVE NEGATIVE Final   Influenza A by PCR NEGATIVE NEGATIVE Final   Influenza B by PCR POSITIVE (A) NEGATIVE Final    Comment: (NOTE) The Xpert Xpress SARS-CoV-2/FLU/RSV plus assay is intended as an aid in the diagnosis of influenza from Nasopharyngeal swab specimens and should not be used as a sole basis for treatment. Nasal washings and aspirates are unacceptable for Xpert Xpress SARS-CoV-2/FLU/RSV testing.  Fact Sheet for Patients: bloggercourse.com  Fact Sheet for Healthcare Providers: seriousbroker.it  This test is not yet approved or cleared by the [place]  FDA and has been authorized for detection and/or diagnosis of SARS-CoV-2 by FDA under an Emergency Use Authorization (EUA). This EUA will remain in effect (meaning this test can be used) for the duration of the COVID-19 declaration under Section 564(b)(1) of the Act, 21 U.S.C. section 360bbb-3(b)(1), unless the authorization is terminated or revoked.     Resp Syncytial Virus by PCR NEGATIVE NEGATIVE Final    Comment: (NOTE) Fact Sheet for Patients: bloggercourse.com  Fact Sheet for Healthcare Providers: seriousbroker.it  This test is not yet approved or cleared by the [place]  FDA and has been authorized for detection and/or diagnosis of SARS-CoV-2 by FDA under an Emergency Use Authorization (EUA). This EUA will remain in effect (meaning this test can be used) for the duration of the COVID-19 declaration under Section 564(b)(1) of the Act, 21 U.S.C. section 360bbb-3(b)(1), unless the authorization is terminated or revoked.  Performed at [facility], [address], [place], [place] [address]   Blood Culture (routine x 2)     Status: None (Preliminary result)   Collection Time: 09/12/24  7:26 PM    Specimen: BLOOD RIGHT ARM  Result Value Ref Range Status   Specimen Description BLOOD RIGHT ARM  Final   Special Requests   Final    BOTTLES DRAWN AEROBIC AND ANAEROBIC Blood Culture adequate volume   Culture   Final    NO GROWTH 2 DAYS Performed at [facility], [address], [place], [place] [address]    Report Status PENDING  Incomplete  Blood Culture (routine x 2)     Status: None (Preliminary result)   Collection Time: 09/12/24  8:01 PM   Specimen: BLOOD LEFT WRIST  Result Value Ref Range Status   Specimen Description BLOOD LEFT WRIST  Final   Special Requests   Final    BOTTLES DRAWN AEROBIC AND ANAEROBIC Blood Culture adequate volume   Culture   Final    NO GROWTH 2 DAYS Performed at [facility], [address], [place], [place] [address]    Report Status PENDING  Incomplete   Labs: CBC: Recent Labs  Lab 09/12/24 2001 09/13/24 0406 09/14/24 0600  WBC 18.9* 19.4* 12.4*  NEUTROABS 16.8* 16.3*  --   HGB 12.6* 12.5* 12.8*  HCT 36.8* 36.6* 37.2*  MCV 88.2 89.3 88.8  PLT 199 195 277   Basic Metabolic Panel: Recent Labs  Lab 09/12/24 2001 09/13/24 0406 09/14/24 0600  NA 130* 134* 138  K 3.7 3.8 4.1  CL 98 103 107  CO2 21* 22 21*  GLUCOSE 105* 156* 100*  BUN 16 17 18   CREATININE 0.84 0.89 0.64  CALCIUM 7.6* 7.9* 8.1*  MG  --  2.1  --   PHOS  --  2.4*  --    Liver Function Tests: Recent Labs  Lab 09/12/24 2001 09/13/24 0406  AST 30 25  ALT 19 18  ALKPHOS 110 128*  BILITOT 0.6 0.4  PROT 5.6* 5.5*  ALBUMIN 2.9* 2.9*   CBG: No results for input(s): GLUCAP in the last 168 hours.  Discharge time spent: 35 minutes  Author: Yetta Blanch, MD  [facility] 09/14/2024   "

## 2024-09-17 LAB — CULTURE, BLOOD (ROUTINE X 2)
Culture: NO GROWTH
Culture: NO GROWTH
Special Requests: ADEQUATE
Special Requests: ADEQUATE

## 2024-09-17 LAB — LEGIONELLA PNEUMOPHILA SEROGP 1 UR AG: L. pneumophila Serogp 1 Ur Ag: NEGATIVE
